# Patient Record
Sex: Female | Born: 1970 | Race: Black or African American | Hispanic: No | Marital: Married | Smoking: Never smoker
Health system: Southern US, Community
[De-identification: ages and names within clinical notes are randomized; demographics above are authoritative.]

## PROBLEM LIST (undated history)

## (undated) ENCOUNTER — Emergency Department (HOSPITAL_COMMUNITY): Discharge status: Absent without leave | Payer: 59

## (undated) DIAGNOSIS — D649 Anemia, unspecified: Secondary | ICD-10-CM

## (undated) DIAGNOSIS — D573 Sickle-cell trait: Secondary | ICD-10-CM

---

## 2016-12-13 NOTE — L&D Delivery Note (Signed)
Delivery Note After a 2 ctx 2nd stage, over a small MLE, at 8:46 PM a viable female was delivered via Vaginal, Spontaneous (Presentation: ;  LAO).  APGAR: 9, 9; weight  .   After 1 minute, the cord was clamped and cut. 40 units of pitocin diluted in 1000cc LR was infused rapidly IV.  The placenta separated spontaneously and delivered via CCT and maternal pushing effort.  It was inspected and appears to be intact with a 3 VC  Anesthesia:  Epidural, local Episiotomy:  MLE; Lacerations:   1st periclitoral (circumcision) lac Suture Repair: 2.0 chromic Est. Blood Loss (mL): 50  Mom to postpartum.  Baby to Couplet care / Skin to Skin.  Oconnor,Gina Kandel 11/24/2017, 9:10 PM  .

## 2017-08-22 LAB — OB RESULTS CONSOLE ANTIBODY SCREEN: Antibody Screen: NEGATIVE

## 2017-08-22 LAB — OB RESULTS CONSOLE HIV ANTIBODY (ROUTINE TESTING): HIV: NONREACTIVE

## 2017-08-22 LAB — OB RESULTS CONSOLE GC/CHLAMYDIA
Chlamydia: NEGATIVE
GC PROBE AMP, GENITAL: NEGATIVE

## 2017-08-22 LAB — OB RESULTS CONSOLE RPR: RPR: NONREACTIVE

## 2017-08-22 LAB — OB RESULTS CONSOLE RUBELLA ANTIBODY, IGM: RUBELLA: IMMUNE

## 2017-08-22 LAB — OB RESULTS CONSOLE HEPATITIS B SURFACE ANTIGEN: Hepatitis B Surface Ag: NEGATIVE

## 2017-08-22 LAB — OB RESULTS CONSOLE ABO/RH: RH Type: POSITIVE

## 2017-09-24 ENCOUNTER — Encounter (HOSPITAL_COMMUNITY): Payer: Self-pay | Admitting: Emergency Medicine

## 2017-09-24 ENCOUNTER — Emergency Department (HOSPITAL_COMMUNITY)
Admission: EM | Admit: 2017-09-24 | Discharge: 2017-09-24 | Disposition: A | Discharge status: Home / Self Care | Payer: BLUE CROSS/BLUE SHIELD | Attending: Emergency Medicine | Admitting: Emergency Medicine

## 2017-09-24 DIAGNOSIS — K0889 Other specified disorders of teeth and supporting structures: Secondary | ICD-10-CM | POA: Diagnosis present

## 2017-09-24 DIAGNOSIS — R600 Localized edema: Secondary | ICD-10-CM | POA: Insufficient documentation

## 2017-09-24 DIAGNOSIS — K029 Dental caries, unspecified: Secondary | ICD-10-CM | POA: Insufficient documentation

## 2017-09-24 DIAGNOSIS — R220 Localized swelling, mass and lump, head: Secondary | ICD-10-CM

## 2017-09-24 MED ORDER — AMOXICILLIN 500 MG PO CAPS
500.0000 mg | ORAL_CAPSULE | Freq: Once | ORAL | Status: AC
  Administered 2017-09-24: 500 mg via ORAL
  Filled 2017-09-24: qty 1

## 2017-09-24 MED ORDER — AMOXICILLIN 500 MG PO CAPS: 500.0000 mg | ORAL_CAPSULE | Freq: Two times a day (BID) | ORAL | 0 refills | Status: AC

## 2017-09-24 NOTE — ED Notes (Signed)
OB rapid respond RN called about pregnant pt with dental pain. OB RN to be called back when pt has a room on the back.

## 2017-09-24 NOTE — ED Triage Notes (Signed)
Pt c/o 10/10 right lower dental pain, having some fevers at home and swollen on her right side face, pt is 7 months pregnant.

## 2017-09-24 NOTE — ED Provider Notes (Signed)
[facility]-EMERGENCY DEPT Provider Note   CSN: [id number] Arrival date & time: 09/24/17  2025     History   Chief Complaint Chief Complaint  Patient presents with  . Dental Pain    possible abscess     HPI Gina Oconnor is a 46 y.o. female.  HPI  46 y.o. female presents to the Emergency Department today due to right lower dental pain. Rates pain 10/10. Described as throbbing. Notes swelling a subjective fevers at home. States that the pain occurred yesterday. Notes hx of dental caries. Pt is able to tolerate PO. NO trouble swallowing. No N/V. No headaches. No pain with ROM neck. Pt without dentist. Pt is 7 months pregnant. Has follow up with OBGYN on Monday. No abdominal pain. No vaginal bleeding. No other symptoms noted.    History reviewed. No pertinent past medical history.  There are no active problems to display for this patient.   History reviewed. No pertinent surgical history.  OB History    Gravida Para Term Preterm AB Living   1             SAB TAB Ectopic Multiple Live Births                   Home Medications    Prior to Admission medications   Not on File    Family History History reviewed. No pertinent family history.  Social History Social History  Substance Use Topics  . Smoking status: Never Smoker  . Smokeless tobacco: Never Used  . Alcohol use No     Allergies   Patient has no known allergies.   Review of Systems Review of Systems ROS reviewed and all are negative for acute change except as noted in the HPI  Physical Exam Updated Vital Signs BP 126/76 (BP Location: Right Arm)   Pulse (!) 109   Temp 99.1 F (37.3 C) (Oral)   Resp 14   Ht  (1.6 m)   LMP 02/25/2017   SpO2 100%   Physical Exam  Constitutional: She is oriented to person, place, and time. She appears well-developed and well-nourished. No distress.  HENT:  Head: Normocephalic and atraumatic.  Right Ear: Tympanic membrane, external ear and ear canal normal.    Left Ear: Tympanic membrane, external ear and ear canal normal.  Nose: Nose normal.  Mouth/Throat: Uvula is midline, oropharynx is clear and moist and mucous membranes are normal. No trismus in the jaw. No oropharyngeal exudate, posterior oropharyngeal erythema or tonsillar abscesses.  Right sided facial swelling noted. Oropharyngeal exam without obvious swelling. No dental abscess. No palpable fluctuance. No trismus. No ludwigs. ROM neck intact without pain.   Eyes: Pupils are equal, round, and reactive to light. EOM are normal.  Neck: Normal range of motion. Neck supple. No tracheal deviation present.  Cardiovascular: Regular rhythm, S1 normal, S2 normal, normal heart sounds, intact distal pulses and normal pulses.  Tachycardia present.   Pulmonary/Chest: Effort normal and breath sounds normal. No respiratory distress. She has no decreased breath sounds. She has no wheezes. She has no rhonchi. She has no rales.  Abdominal: Normal appearance and bowel sounds are normal. There is no tenderness.  Musculoskeletal: Normal range of motion.  Neurological: She is alert and oriented to person, place, and time.  Skin: Skin is warm and dry.  Psychiatric: She has a normal mood and affect. Her speech is normal and behavior is normal. Thought content normal.     ED Treatments / Results  Labs (all labs ordered are listed, but only abnormal results are displayed) Labs Reviewed - No data to display  EKG  EKG Interpretation None       Radiology No results found.  Procedures Procedures (including critical care time)  Medications Ordered in ED Medications - No data to display   Initial Impression / Assessment and Plan / ED Course  I have reviewed the triage vital signs and the nursing notes.  Pertinent labs & imaging results that were available during my care of the patient were reviewed by me and considered in my medical decision making (see chart for details).  Final Clinical  Impressions(s) / ED Diagnoses     {I have reviewed the relevant previous healthcare records.  {I obtained HPI from historian.   ED Course:  Assessment: Dental pain associated with dental cary but no signs or symptoms of dental abscess with patient afebrile, non toxic appearing and swallowing secretions well. Exam unconcerning for Ludwig's angina or other deep tissue infection in neck. As there is no gum swelling, no erythema, but facial swelling, will treat with antibiotic and pain medicine. Urged patient to follow-up with dentist. Given resources.    I gave patient referral to dentist and stressed the importance of dental follow up for ultimate management of dental pain. Patient voices understanding and is agreeable to plan  Disposition/Plan:  DC Home Additional Verbal discharge instructions given and discussed with patient.  Pt Instructed to f/u with PCP in the next week for evaluation and treatment of symptoms. Return precautions given Pt acknowledges and agrees with plan  Supervising Physician Loren Racer, MD  Final diagnoses:  Dental caries  Pain, dental  Facial swelling    New Prescriptions New Prescriptions   No medications on file     Wilber Bihari 09/24/17 2222    Loren Racer, MD 09/27/17 [phone]

## 2017-09-24 NOTE — Discharge Instructions (Signed)
Please read and follow all provided instructions.  Your diagnoses today include:  1. Dental caries   2. Pain, dental   3. Facial swelling     Tests performed today include: Vital signs. See below for your results today.   Medications prescribed:  Take as prescribed   Home care instructions:  Follow any educational materials contained in this packet.  Follow-up instructions: Please follow-up with a Dentist for further evaluation of symptoms and treatment. See contact information.    Return instructions:  Please return to the Emergency Department if you do not get better, if you get worse, or new symptoms OR  - Fever (temperature greater than 101.103F)  - Bleeding that does not stop with holding pressure to the area    -Severe pain (please note that you may be more sore the day after your accident)  - Chest Pain  - Difficulty breathing  - Severe nausea or vomiting  - Inability to tolerate food and liquids  - Passing out  - Skin becoming red around your wounds  - Change in mental status (confusion or lethargy)  - New numbness or weakness    Please return if you have any other emergent concerns.  Additional Information:  Your vital signs today were: BP 126/76 (BP Location: Right Arm)    Pulse (!) 109    Temp 99.1 F (37.3 C) (Oral)    Resp 14    Ht  (1.6 m)    LMP 02/25/2017    SpO2 100%  If your blood pressure (BP) was elevated above 135/85 this visit, please have this repeated by your doctor within one month. ---------------

## 2017-11-07 LAB — OB RESULTS CONSOLE GBS: STREP GROUP B AG: NEGATIVE

## 2017-11-18 ENCOUNTER — Encounter (HOSPITAL_COMMUNITY): Payer: Self-pay

## 2017-11-18 ENCOUNTER — Other Ambulatory Visit (HOSPITAL_COMMUNITY): Payer: Self-pay | Admitting: Family

## 2017-11-18 DIAGNOSIS — O09521 Supervision of elderly multigravida, first trimester: Secondary | ICD-10-CM

## 2017-11-18 DIAGNOSIS — Z3A38 38 weeks gestation of pregnancy: Secondary | ICD-10-CM

## 2017-11-22 ENCOUNTER — Encounter (HOSPITAL_COMMUNITY): Payer: Self-pay

## 2017-11-22 ENCOUNTER — Ambulatory Visit (HOSPITAL_COMMUNITY)
Admission: RE | Admit: 2017-11-22 | Discharge: 2017-11-22 | Disposition: A | Discharge status: Home / Self Care | Payer: BLUE CROSS/BLUE SHIELD | Source: Ambulatory Visit | Attending: Obstetrics & Gynecology | Admitting: Obstetrics & Gynecology

## 2017-11-22 DIAGNOSIS — O09523 Supervision of elderly multigravida, third trimester: Secondary | ICD-10-CM | POA: Diagnosis present

## 2017-11-22 DIAGNOSIS — Z3A38 38 weeks gestation of pregnancy: Secondary | ICD-10-CM | POA: Insufficient documentation

## 2017-11-22 DIAGNOSIS — Z315 Encounter for genetic counseling: Secondary | ICD-10-CM | POA: Insufficient documentation

## 2017-11-22 DIAGNOSIS — O09521 Supervision of elderly multigravida, first trimester: Secondary | ICD-10-CM

## 2017-11-22 HISTORY — DX: Sickle-cell trait: D57.3

## 2017-11-22 HISTORY — DX: Anemia, unspecified: D64.9

## 2017-11-22 NOTE — Procedures (Signed)
Stormie Goettl 05/19/1971 5352w4d  Fetus A Non-Stress Test Interpretation for 11/22/17  Indication: Advanced Maternal Age >40 years  Fetal Heart Rate A Mode: External Baseline Rate (A): 140 bpm Variability: Moderate Accelerations: 15 x 15 Decelerations: None Multiple birth?: No  Uterine Activity Mode: Palpation, Toco Contraction Frequency (min): 2-4 Contraction Duration (sec): 20-120 Contraction Quality: Mild Resting Tone Palpated: Relaxed Resting Time: Adequate  Interpretation (Fetal Testing) Nonstress Test Interpretation: Reactive Overall Impression: Reassuring for gestational age Comments: Reviewed tracing with Dr. Ezzard StandingNewman

## 2017-11-24 ENCOUNTER — Inpatient Hospital Stay (HOSPITAL_COMMUNITY): Payer: BLUE CROSS/BLUE SHIELD | Admitting: Anesthesiology

## 2017-11-24 ENCOUNTER — Other Ambulatory Visit: Payer: Self-pay

## 2017-11-24 ENCOUNTER — Encounter (HOSPITAL_COMMUNITY): Payer: Self-pay

## 2017-11-24 ENCOUNTER — Inpatient Hospital Stay (HOSPITAL_COMMUNITY)
Admission: AD | Admit: 2017-11-24 | Discharge: 2017-11-26 | DRG: 807 | Disposition: A | Discharge status: Home / Self Care | Payer: BLUE CROSS/BLUE SHIELD | Source: Ambulatory Visit | Attending: Obstetrics and Gynecology | Admitting: Obstetrics and Gynecology

## 2017-11-24 DIAGNOSIS — O134 Gestational [pregnancy-induced] hypertension without significant proteinuria, complicating childbirth: Principal | ICD-10-CM | POA: Diagnosis present

## 2017-11-24 DIAGNOSIS — N90810 Female genital mutilation status, unspecified: Secondary | ICD-10-CM | POA: Diagnosis present

## 2017-11-24 DIAGNOSIS — O3483 Maternal care for other abnormalities of pelvic organs, third trimester: Secondary | ICD-10-CM | POA: Diagnosis present

## 2017-11-24 DIAGNOSIS — Z3A38 38 weeks gestation of pregnancy: Secondary | ICD-10-CM

## 2017-11-24 DIAGNOSIS — Z23 Encounter for immunization: Secondary | ICD-10-CM | POA: Diagnosis not present

## 2017-11-24 DIAGNOSIS — Z3483 Encounter for supervision of other normal pregnancy, third trimester: Secondary | ICD-10-CM | POA: Diagnosis present

## 2017-11-24 DIAGNOSIS — O9902 Anemia complicating childbirth: Secondary | ICD-10-CM | POA: Diagnosis present

## 2017-11-24 DIAGNOSIS — D573 Sickle-cell trait: Secondary | ICD-10-CM | POA: Diagnosis present

## 2017-11-24 DIAGNOSIS — O165 Unspecified maternal hypertension, complicating the puerperium: Secondary | ICD-10-CM

## 2017-11-24 LAB — COMPREHENSIVE METABOLIC PANEL
ALBUMIN: 2.9 g/dL — AB (ref 3.5–5.0)
ALT: 9 U/L — AB (ref 14–54)
AST: 20 U/L (ref 15–41)
Alkaline Phosphatase: 163 U/L — ABNORMAL HIGH (ref 38–126)
Anion gap: 10 (ref 5–15)
BUN: 7 mg/dL (ref 6–20)
CALCIUM: 8.6 mg/dL — AB (ref 8.9–10.3)
CO2: 19 mmol/L — AB (ref 22–32)
CREATININE: 0.55 mg/dL (ref 0.44–1.00)
Chloride: 105 mmol/L (ref 101–111)
GFR calc Af Amer: >60 mL/min (ref >60)
GFR calc non Af Amer: >60 mL/min (ref >60)
GLUCOSE: 81 mg/dL (ref 65–99)
Potassium: 3.8 mmol/L (ref 3.5–5.1)
SODIUM: 134 mmol/L — AB (ref 135–145)
Total Bilirubin: 0.5 mg/dL (ref 0.3–1.2)
Total Protein: 6.8 g/dL (ref 6.5–8.1)

## 2017-11-24 LAB — PROTEIN / CREATININE RATIO, URINE
Creatinine, Urine: 36 mg/dL
Protein Creatinine Ratio: 0.22 mg/mg{Cre} — ABNORMAL HIGH (ref 0.00–0.15)
Total Protein, Urine: 8 mg/dL

## 2017-11-24 LAB — ABO/RH: ABO/RH(D): O POS

## 2017-11-24 LAB — CBC
HEMATOCRIT: 34.1 % — AB (ref 36.0–46.0)
HEMOGLOBIN: 11.8 g/dL — AB (ref 12.0–15.0)
MCH: 30.6 pg (ref 26.0–34.0)
MCHC: 34.6 g/dL (ref 30.0–36.0)
MCV: 88.6 fL (ref 78.0–100.0)
Platelets: 296 10*3/uL (ref 150–400)
RBC: 3.85 MIL/uL — ABNORMAL LOW (ref 3.87–5.11)
RDW: 13.7 % (ref 11.5–15.5)
WBC: 8.6 10*3/uL (ref 4.0–10.5)

## 2017-11-24 LAB — TYPE AND SCREEN
ABO/RH(D): O POS
ANTIBODY SCREEN: NEGATIVE

## 2017-11-24 LAB — POCT FERN TEST

## 2017-11-24 MED ORDER — FLEET ENEMA 7-19 GM/118ML RE ENEM: 1.0000 | ENEMA | RECTAL | Status: DC | PRN

## 2017-11-24 MED ORDER — LACTATED RINGERS IV SOLN: 500.0000 mL | INTRAVENOUS | Status: DC | PRN

## 2017-11-24 MED ORDER — OXYTOCIN 40 UNITS IN LACTATED RINGERS INFUSION - SIMPLE MED
2.5000 [IU]/h | INTRAVENOUS | Status: DC
  Administered 2017-11-24: 2.5 [IU]/h via INTRAVENOUS

## 2017-11-24 MED ORDER — OXYCODONE-ACETAMINOPHEN 5-325 MG PO TABS: 2.0000 | ORAL_TABLET | ORAL | Status: DC | PRN

## 2017-11-24 MED ORDER — PHENYLEPHRINE 40 MCG/ML (10ML) SYRINGE FOR IV PUSH (FOR BLOOD PRESSURE SUPPORT)
80.0000 ug | PREFILLED_SYRINGE | INTRAVENOUS | Status: DC | PRN
  Filled 2017-11-24: qty 5

## 2017-11-24 MED ORDER — OXYCODONE-ACETAMINOPHEN 5-325 MG PO TABS: 1.0000 | ORAL_TABLET | ORAL | Status: DC | PRN

## 2017-11-24 MED ORDER — PRENATAL MULTIVITAMIN CH
1.0000 | ORAL_TABLET | Freq: Every day | ORAL | Status: DC
  Administered 2017-11-25 – 2017-11-26 (×2): 1 via ORAL
  Filled 2017-11-24 (×2): qty 1

## 2017-11-24 MED ORDER — FENTANYL 2.5 MCG/ML BUPIVACAINE 1/10 % EPIDURAL INFUSION (WH - ANES)
14.0000 mL/h | INTRAMUSCULAR | Status: DC | PRN
  Administered 2017-11-24: 14 mL/h via EPIDURAL

## 2017-11-24 MED ORDER — MEASLES, MUMPS & RUBELLA VAC ~~LOC~~ INJ
0.5000 mL | INJECTION | Freq: Once | SUBCUTANEOUS | Status: DC
  Filled 2017-11-24: qty 0.5

## 2017-11-24 MED ORDER — COCONUT OIL OIL
1.0000 "application " | TOPICAL_OIL | Status: DC | PRN
  Administered 2017-11-26: 1 via TOPICAL
  Filled 2017-11-24: qty 120

## 2017-11-24 MED ORDER — FENTANYL 2.5 MCG/ML BUPIVACAINE 1/10 % EPIDURAL INFUSION (WH - ANES)
INTRAMUSCULAR | Status: AC
  Filled 2017-11-24: qty 100

## 2017-11-24 MED ORDER — WITCH HAZEL-GLYCERIN EX PADS: 1.0000 "application " | MEDICATED_PAD | CUTANEOUS | Status: DC | PRN

## 2017-11-24 MED ORDER — DOCUSATE SODIUM 100 MG PO CAPS
100.0000 mg | ORAL_CAPSULE | Freq: Two times a day (BID) | ORAL | Status: DC
  Administered 2017-11-25 – 2017-11-26 (×3): 100 mg via ORAL
  Filled 2017-11-24 (×3): qty 1

## 2017-11-24 MED ORDER — ACETAMINOPHEN 325 MG PO TABS
650.0000 mg | ORAL_TABLET | ORAL | Status: DC | PRN
  Administered 2017-11-25 – 2017-11-26 (×2): 650 mg via ORAL
  Filled 2017-11-24: qty 2

## 2017-11-24 MED ORDER — DIPHENHYDRAMINE HCL 25 MG PO CAPS: 25.0000 mg | ORAL_CAPSULE | Freq: Four times a day (QID) | ORAL | Status: DC | PRN

## 2017-11-24 MED ORDER — OXYTOCIN 40 UNITS IN LACTATED RINGERS INFUSION - SIMPLE MED
1.0000 m[IU]/min | INTRAVENOUS | Status: DC
  Administered 2017-11-24: 2 m[IU]/min via INTRAVENOUS
  Filled 2017-11-24: qty 1000

## 2017-11-24 MED ORDER — DIPHENHYDRAMINE HCL 50 MG/ML IJ SOLN: 12.5000 mg | INTRAMUSCULAR | Status: DC | PRN

## 2017-11-24 MED ORDER — ONDANSETRON HCL 4 MG/2ML IJ SOLN: 4.0000 mg | INTRAMUSCULAR | Status: DC | PRN

## 2017-11-24 MED ORDER — ACETAMINOPHEN 325 MG PO TABS
650.0000 mg | ORAL_TABLET | ORAL | Status: DC | PRN
  Filled 2017-11-24: qty 2

## 2017-11-24 MED ORDER — DIBUCAINE 1 % RE OINT: 1.0000 "application " | TOPICAL_OINTMENT | RECTAL | Status: DC | PRN

## 2017-11-24 MED ORDER — BENZOCAINE-MENTHOL 20-0.5 % EX AERO: 1.0000 "application " | INHALATION_SPRAY | CUTANEOUS | Status: DC | PRN

## 2017-11-24 MED ORDER — ONDANSETRON HCL 4 MG PO TABS: 4.0000 mg | ORAL_TABLET | ORAL | Status: DC | PRN

## 2017-11-24 MED ORDER — EPHEDRINE 5 MG/ML INJ
10.0000 mg | INTRAVENOUS | Status: DC | PRN
  Filled 2017-11-24: qty 2

## 2017-11-24 MED ORDER — FERROUS SULFATE 325 (65 FE) MG PO TABS
325.0000 mg | ORAL_TABLET | Freq: Two times a day (BID) | ORAL | Status: DC
  Administered 2017-11-25 – 2017-11-26 (×4): 325 mg via ORAL
  Filled 2017-11-24 (×4): qty 1

## 2017-11-24 MED ORDER — BISACODYL 10 MG RE SUPP: 10.0000 mg | Freq: Every day | RECTAL | Status: DC | PRN

## 2017-11-24 MED ORDER — TETANUS-DIPHTH-ACELL PERTUSSIS 5-2.5-18.5 LF-MCG/0.5 IM SUSP: 0.5000 mL | Freq: Once | INTRAMUSCULAR | Status: DC

## 2017-11-24 MED ORDER — METHYLERGONOVINE MALEATE 0.2 MG/ML IJ SOLN: 0.2000 mg | INTRAMUSCULAR | Status: DC | PRN

## 2017-11-24 MED ORDER — INFLUENZA VAC SPLIT QUAD 0.5 ML IM SUSY
0.5000 mL | PREFILLED_SYRINGE | INTRAMUSCULAR | Status: AC
  Administered 2017-11-26: 0.5 mL via INTRAMUSCULAR
  Filled 2017-11-24: qty 0.5

## 2017-11-24 MED ORDER — FENTANYL CITRATE (PF) 100 MCG/2ML IJ SOLN: 100.0000 ug | INTRAMUSCULAR | Status: DC | PRN

## 2017-11-24 MED ORDER — FLEET ENEMA 7-19 GM/118ML RE ENEM: 1.0000 | ENEMA | Freq: Every day | RECTAL | Status: DC | PRN

## 2017-11-24 MED ORDER — TERBUTALINE SULFATE 1 MG/ML IJ SOLN
0.2500 mg | Freq: Once | INTRAMUSCULAR | Status: DC | PRN
  Filled 2017-11-24: qty 1

## 2017-11-24 MED ORDER — SOD CITRATE-CITRIC ACID 500-334 MG/5ML PO SOLN: 30.0000 mL | ORAL | Status: DC | PRN

## 2017-11-24 MED ORDER — ONDANSETRON HCL 4 MG/2ML IJ SOLN: 4.0000 mg | Freq: Four times a day (QID) | INTRAMUSCULAR | Status: DC | PRN

## 2017-11-24 MED ORDER — METHYLERGONOVINE MALEATE 0.2 MG PO TABS: 0.2000 mg | ORAL_TABLET | ORAL | Status: DC | PRN

## 2017-11-24 MED ORDER — LACTATED RINGERS IV SOLN
500.0000 mL | Freq: Once | INTRAVENOUS | Status: AC
  Administered 2017-11-24: 500 mL via INTRAVENOUS

## 2017-11-24 MED ORDER — LIDOCAINE HCL (PF) 1 % IJ SOLN
INTRAMUSCULAR | Status: DC | PRN
  Administered 2017-11-24: 3 mL via EPIDURAL
  Administered 2017-11-24: 2 mL via EPIDURAL
  Administered 2017-11-24: 5 mL via EPIDURAL

## 2017-11-24 MED ORDER — IBUPROFEN 600 MG PO TABS
600.0000 mg | ORAL_TABLET | Freq: Four times a day (QID) | ORAL | Status: DC
  Administered 2017-11-25 – 2017-11-26 (×8): 600 mg via ORAL
  Filled 2017-11-24 (×8): qty 1

## 2017-11-24 MED ORDER — OXYCODONE HCL 5 MG PO TABS: 5.0000 mg | ORAL_TABLET | ORAL | Status: DC | PRN

## 2017-11-24 MED ORDER — ZOLPIDEM TARTRATE 5 MG PO TABS
5.0000 mg | ORAL_TABLET | Freq: Every evening | ORAL | Status: DC | PRN
Start: 2017-11-24 — End: 2017-11-26

## 2017-11-24 MED ORDER — SIMETHICONE 80 MG PO CHEW: 80.0000 mg | CHEWABLE_TABLET | ORAL | Status: DC | PRN

## 2017-11-24 MED ORDER — LIDOCAINE HCL (PF) 1 % IJ SOLN
30.0000 mL | INTRAMUSCULAR | Status: AC | PRN
  Administered 2017-11-24: 30 mL via SUBCUTANEOUS
  Filled 2017-11-24: qty 30

## 2017-11-24 MED ORDER — LACTATED RINGERS IV SOLN
INTRAVENOUS | Status: DC
  Administered 2017-11-24 (×3): via INTRAVENOUS

## 2017-11-24 MED ORDER — OXYCODONE HCL 5 MG PO TABS: 10.0000 mg | ORAL_TABLET | ORAL | Status: DC | PRN

## 2017-11-24 MED ORDER — LACTATED RINGERS IV SOLN: 500.0000 mL | Freq: Once | INTRAVENOUS | Status: DC

## 2017-11-24 MED ORDER — OXYTOCIN BOLUS FROM INFUSION
500.0000 mL | Freq: Once | INTRAVENOUS | Status: AC
  Administered 2017-11-24: 500 mL via INTRAVENOUS

## 2017-11-24 NOTE — H&P (Signed)
LABOR AND DELIVERY ADMISSION HISTORY AND PHYSICAL NOTE  Gina Oconnor is a 46 y.o. female 446P5005 with IUP at 633w6d presenting with SROM. She experienced gush of clear liquid at midnight 12/12, color later turned brown.  She reports positive fetal movement. She denies bleeding.  Prenatal History/Complications: PNC at HD Pregnancy complications:  - AMA  Past Medical History: Past Medical History:  Diagnosis Date  . Anemia   . Sickle cell trait (HCC)     Past Surgical History: History reviewed. No pertinent surgical history.  Obstetrical History: OB History    Gravida Para Term Preterm AB Living   6 5 5     5    SAB TAB Ectopic Multiple Live Births                  Social History: Social History   Socioeconomic History  . Marital status: Married    Spouse name: None  . Number of children: None  . Years of education: None  . Highest education level: None  Social Needs  . Financial resource strain: None  . Food insecurity - worry: None  . Food insecurity - inability: None  . Transportation needs - medical: None  . Transportation needs - non-medical: None  Occupational History  . None  Tobacco Use  . Smoking status: Never Smoker  . Smokeless tobacco: Never Used  Substance and Sexual Activity  . Alcohol use: No  . Drug use: No  . Sexual activity: None  Other Topics Concern  . None  Social History Narrative  . None    Family History: History reviewed. No pertinent family history.  Allergies: No Known Allergies  Medications Prior to Admission  Medication Sig Dispense Refill Last Dose  . acetaminophen (TYLENOL) 325 MG tablet Take 650 mg by mouth every 6 (six) hours as needed for mild pain.    Past Month at Unknown time  . Prenatal Vit-Fe Fumarate-FA (PRENATAL MULTIVITAMIN) TABS tablet Take 1 tablet by mouth daily at 12 noon.   11/23/2017 at Unknown time     Review of Systems  All systems reviewed and negative except as stated in HPI  Physical  Exam Blood pressure 123/85, pulse 80, temperature 99.1 F (37.3 C), temperature source Oral, resp. rate 17, height 5\' 5"  (1.651 m), weight 62.6 kg (138 lb), last menstrual period 02/25/2017, SpO2 100 %. General appearance: alert and cooperative Lungs: no respiratory distress Heart: regular rate with no distress Abdomen: soft, non-tender; bowel sounds normal Extremities: No calf swelling or tenderness Presentation: cephalic Fetal monitoring: external Uterine activity: [place]: Ballotable Exam by:: Intelolita CNM  Prenatal labs: ABO, Rh: O/Positive/-- (09/10 0000) Antibody: Negative (09/10 0000) Rubella: Immune (09/10 0000) RPR: Nonreactive (09/10 0000)  HBsAg: Negative (09/10 0000)  HIV: Non-reactive (09/10 0000)  GC/Chlamydia: Neg GBS:   Negative on 11/21 per HD 1 hr Glucola: 115 Genetic screening:  SCT pos, CF neg Anatomy US: normal at 7654w3d  Prenatal Transfer Tool  Maternal Diabetes: No Genetic Screening: Abnormal:  Results: Other: SCT pos Maternal Ultrasounds/Referrals: Normal Fetal Ultrasounds or other Referrals:  Referred to Materal Fetal Medicine  Maternal Substance Abuse:  No Significant Maternal Medications:  None Significant Maternal Lab Results: None  Results for orders placed or performed during the hospital encounter of 11/24/17 (from the past 24 hour(s))  Fern Test   Collection Time: 11/24/17 10:30 AM  Result Value Ref Range   POCT Fern Test      Patient Active Problem List   Diagnosis Date Noted  .  Normal labor 11/24/2017    Assessment: Gina Oconnor is a 46 y.o. G6P5005 at 1170w6d here for SVD. She experienced SROM at midnight 12/12, fern test positive.  #Labor: will consider pitocin given cervical check @4  #Pain: requests epidural #FWB:  Category I #ID: Patient is not certain about GBS status.  Spoke with Linus OrnMary Shaw, RN at HD who reported negative on 11/21. #MOF: Breast #MOC: Has previously used OCPs, but developed HTN.  Is unsure about future  contraception. #Circ: n/a  Larose HiresChristopher Trennepohl 11/24/2017, 12:48 PM   I have personally seen the patient and verify the information documented above -Dr. Marthenia RollingScott Bland  The patient was seen and examined by me also Resident actually did the H&P, witnessed by me Interpretor used on phone Agree with note NST reactive and reassuring UCs as listed Cervical exams as listed in note  Prefers female providers but is willing to have female if medically necessary  Aviva SignsWilliams, Marie L, CNM  I confirm that I have verified the information documented in the resident's note and that I have also personally reperformed the physical exam and all medical decision making activities.  Aviva SignsWilliams, Marie L, CNM

## 2017-11-24 NOTE — Anesthesia Pain Management Evaluation Note (Signed)
  [facility]NA Pain Management Visit Note  Patient: Gina Oconnor, 46 y.o., female  "Hello I am a [facility] of the anesthesia [facility]. We have an anesthesia team available at all times to provide care throughout the hospital, including epidural management and anesthesia for C-section. I don't know your plan for the delivery whether it a natural birth, water birth, IV sedation, nitrous supplementation, doula or epidural, but we want to meet your pain goals."   1.Was your pain managed to your expectations on prior hospitalizations?   Yes   2.What is your expectation for pain management during this hospitalization?     Epidural  3.How can we help you reach that goal? Assess and be available for pain control  Record the patient's initial score and the patient's pain goal.   Pain: 5  Pain Goal: 6 [facility] wants you to be able to say your pain was always managed very well.  Gina Oconnor, Gina Oconnor 11/24/2017

## 2017-11-24 NOTE — Anesthesia Procedure Notes (Signed)
Epidural Patient location during procedure: OB Start time: 11/24/2017 3:31 PM End time: 11/24/2017 3:37 PM  Staffing Anesthesiologist: Cecile Hearingurk, Stephen Edward, MD Performed: anesthesiologist   Preanesthetic Checklist Completed: patient identified, pre-op evaluation, timeout performed, IV checked, risks and benefits discussed and monitors and equipment checked  Epidural Patient position: sitting Prep: DuraPrep Patient monitoring: blood pressure and continuous pulse ox Approach: midline Location: L3-L4 Injection technique: LOR air  Needle:  Needle type: Tuohy  Needle gauge: 17 G Needle length: 9 cm Needle insertion depth: 4 cm Catheter size: 19 Gauge Catheter at skin depth: 8 cm Test dose: negative and Other (1% Lidocaine)  Additional Notes Patient identified.  Risk benefits discussed including failed block, incomplete pain control, headache, nerve damage, paralysis, blood pressure changes, nausea, vomiting, reactions to medication both toxic or allergic, and postpartum back pain.  Patient expressed understanding and wished to proceed.  All questions were answered.  Sterile technique used throughout procedure and epidural site dressed with sterile barrier dressing. No paresthesia or other complications noted. The patient did not experience any signs of intravascular injection such as tinnitus or metallic taste in mouth nor signs of intrathecal spread such as rapid motor block. Please see nursing notes for vital signs. Reason for block:procedure for pain

## 2017-11-24 NOTE — Anesthesia Preprocedure Evaluation (Signed)
Anesthesia Evaluation  Patient identified by MRN, date of birth, ID band Patient awake    Reviewed: Allergy & Precautions, NPO status , Patient's Chart, lab work & pertinent test results  Airway Mallampati: II  TM Distance: >3 FB Neck ROM: Full    Dental  (+) Teeth Intact, Dental Advisory Given   Pulmonary neg pulmonary ROS,    Pulmonary exam normal breath sounds clear to auscultation       Cardiovascular negative cardio ROS Normal cardiovascular exam Rhythm:Regular Rate:Normal     Neuro/Psych negative neurological ROS     GI/Hepatic negative GI ROS, Neg liver ROS,   Endo/Other  negative endocrine ROS  Renal/GU negative Renal ROS     Musculoskeletal negative musculoskeletal ROS (+)   Abdominal   Peds  Hematology  (+) Blood dyscrasia, Sickle cell trait and anemia , Plt 296k   Anesthesia Other Findings Day of surgery medications reviewed with the patient.  Reproductive/Obstetrics (+) Pregnancy                             Anesthesia Physical Anesthesia Plan  ASA: II  Anesthesia Plan: Epidural   Post-op Pain Management:    Induction:   PONV Risk Score and Plan: 2 and Treatment may vary due to age or medical condition  Airway Management Planned:   Additional Equipment:   Intra-op Plan:   Post-operative Plan:   Informed Consent: I have reviewed the patients History and Physical, chart, labs and discussed the procedure including the risks, benefits and alternatives for the proposed anesthesia with the patient or authorized representative who has indicated his/her understanding and acceptance.   Dental advisory given  Plan Discussed with:   Anesthesia Plan Comments: (Patient identified. Risks/Benefits/Options discussed with patient including but not limited to bleeding, infection, nerve damage, paralysis, failed block, incomplete pain control, headache, blood pressure changes,  nausea, vomiting, reactions to medication both or allergic, itching and postpartum back pain. Confirmed with bedside nurse the patient's most recent platelet count. Confirmed with patient that they are not currently taking any anticoagulation, have any bleeding history or any family history of bleeding disorders. Patient expressed understanding and wished to proceed. All questions were answered.   Interpreter utilized throughout entire patient encounter.)        Anesthesia Quick Evaluation

## 2017-11-24 NOTE — MAU Note (Signed)
[facility] position of fetus - vertex position.

## 2017-11-24 NOTE — MAU Note (Signed)
Interpreter [id number]: Pt. Here due to possible SROM at midnight, at first clear fluid, then white, then brown with bloody spots. Large amount. Positive for fetal movement. FHR - 140s, Toco applied - abd. Soft. Pt. Not experiencing many contractions.

## 2017-11-25 LAB — RPR: RPR Ser Ql: NONREACTIVE

## 2017-11-25 NOTE — Anesthesia Postprocedure Evaluation (Signed)
Anesthesia Post Note  Patient: Gina Oconnor  Procedure(s) Performed: AN AD HOC LABOR EPIDURAL     Patient location during evaluation: Mother Baby Anesthesia Type: Epidural Level of consciousness: awake and alert, oriented and patient cooperative Pain management: pain level controlled Vital Signs Assessment: post-procedure vital signs reviewed and stable Respiratory status: spontaneous breathing Cardiovascular status: stable Postop Assessment: no headache, epidural receding, patient able to bend at knees and no signs of nausea or vomiting Anesthetic complications: no Comments: Pain score 5.    Last Vitals:  Vitals:   11/25/17 0010 11/25/17 0411  BP: 134/72 125/70  Pulse: 87 78  Resp: 20   Temp: 37.1 C 37.1 C  SpO2:  100%    Last Pain:  Vitals:   11/25/17 0611  TempSrc:   PainSc: 0-No pain   Pain Goal:                 [facility] Surgical Center LtdWRINKLE,Alis Sawchuk

## 2017-11-25 NOTE — Progress Notes (Signed)
Post Partum Day 1 Subjective: no complaints, up ad lib and voiding  Objective: Blood pressure 132/81, pulse 79, temperature 98.6 F (37 C), temperature source Oral, resp. rate 16, height 5\' 5"  (1.651 m), weight 138 lb (62.6 kg), last menstrual period 02/25/2017, SpO2 100 %, unknown if currently breastfeeding.  Physical Exam:  General: alert, cooperative and no distress Lochia: appropriate Uterine Fundus: firm Incision: healing well DVT Evaluation: No evidence of DVT seen on physical exam.  Recent Labs    11/24/17 1055  HGB 11.8*  HCT 34.1*    Assessment/Plan: Plan for discharge tomorrow and Breastfeeding   LOS: 1 day   Wynelle BourgeoisMarie Matan Steen 11/25/2017, 11:47 AM

## 2017-11-25 NOTE — Clinical Social Work Maternal (Signed)
CLINICAL SOCIAL WORK MATERNAL/CHILD NOTE  Patient Details  Name: Gina Oconnor MRN: [id number] Date of Birth: [birth year]  Date:  11/25/2017  Clinical Social Worker Initiating Note:  Laurey Arrow Date/Time: Initiated:  11/25/17/1311     Child's Name:  unknown   Biological Parents:  Mother, Father   Need for Interpreter:  None(CSW offered to utilized interpreting services, however, MOB was adamant that MOB brother assist with language barrier. )   Reason for Referral:  Other (Comment)(community resources. )   Address:  [address] [place] [address]    Phone number:  [phone] (home)     Additional phone number:   Household Members/Support Persons (HM/SP):   (MOB resides with MOB's husband and 6 children. )   HM/SP Name Relationship DOB or Age  HM/SP -1        HM/SP -2        HM/SP -3        HM/SP -4        HM/SP -5        HM/SP -6        HM/SP -7        HM/SP -8          Natural Supports (not living in the home):  Friends, Extended Family   Professional Supports: None   Employment: Unemployed   Type of Work:     Education:      Homebound arranged:    Pensions consultant:  Multimedia programmer, [employer]   Other Resources:  Physicist, medical , [place] Considerations Which May Impact Care:  None reported  Strengths:  Ability to meet basic needs , Pediatrician chosen   Psychotropic Medications:         Pediatrician:    [place]olic[place] area  Pediatrician List:   [facility] ([address])  [place]      Pediatrician Fax Number:    Risk Factors/Current Problems:  None   Cognitive State:  Alert , Able to Concentrate , Linear Thinking    Mood/Affect:  Bright , Interested , Happy , Comfortable , Relaxed    CSW Assessment: CSW met with MOB and in room 133.  When CSW arrived, MOB had 2 female guest that  MOB identified as MOB's brothers.  MOB gave CSW permission to complete the assessment while MOB's guest were present. CSW attempting to utilized "[place]" for language barrier and MOB declined interpreter and communicated that MOB's brother can assist if needed.   CSW inquired about MOB having basic necessities for infant and MOB reported that MOB receives [facility] and Physicist, medical.  CSW asked about a car seat and MOB's brother informed CSW that the family will obtain a car seat prior to infant's d/c.  MOB communicated that MOB was informed that Medicaid will pay for the infant's car seat . CSW informed MOB that Medicaid does not provide car seats and assessed to see if the family will need assistance with obtaining one. MOB's brother was adamant that a car seat will be provided. CSW asked about a safe sleeping area for infant. At this time, MOB reported not having a crib or bassinet.  CSW suggested MOB complete the tutorial for a Baby Box, and MOB was receptive. MOB will provide MOB's bedside nurse with the tutorial code after completion.   CSW provided SIDS and PDD education.  MOB responded appropriate to CSW questions and asked appropriate questions.  CSW updated bedside nurse.   CSW Plan/Description:  No Further Intervention Required/No Barriers to Discharge, Sudden Infant Death Syndrome (SIDS) Education, Perinatal Mood and Anxiety Disorder (PMADs) Education, Other Patient/Family Education   Laurey Arrow, MSW, LCSW Clinical Social Work [phone]    Dimple Nanas, LCSW 11/25/2017, 1:14 PM

## 2017-11-25 NOTE — Lactation Note (Signed)
This note was copied from a baby's chart. Lactation Consultation Note  Patient Name: Gina Oconnor Today's Date: 11/25/2017 Reason for consult: Initial assessment Infant is 5218 hours old & seen by Lactation for Initial Assessment. Baby was born at 6358w6d and weighed 6 lbs 4.9 oz at birth. This is mom's 6th child. Mom's RN called LC to discuss how challenging it has been trying to latch baby and thinks it is due to mom's larger nipples and baby's smaller mouth. Mom's sister-in-law interpreted for us (declined need for video interpreter). Baby was asleep when LC entered. Mom reports that baby has been sleeping a lot and when asked if mom wanted to try BF now, mom declined. Mom reports she BF for ~7625yrs with her other children and that one of them had a difficult time latching onto her left breast in the beginning as well but eventually learned. Mom reports she has tried to hand express but nothing was seen. Suggested mom use DEBP to stimulate breasts since baby is not BF well. Provided mom with BF booklet, BF resources, and feeding log; mom made aware of O/P services, breastfeeding support groups, community resources, and our phone # for post-discharge questions. Mom encouraged to feed baby 8-12 times/24 hours and with feeding cues.  Set up & showed mom how to use & clean DEBP. Encouraged mom to pump on Initiate phase at least q 3hrs if baby does not latch well. Size 27mm breast shields fit better than the 24mm. Reviewed hand expression with mom but no drops were seen then either. Discussed importance of stimulation even if she does not see any milk for now. Mom reports no questions at this time. Encouraged mom to ask for help as needed.   Maternal Data Has patient been taught Hand Expression?: Yes Does the patient have breastfeeding experience prior to this delivery?: Yes  Feeding Feeding Type: Breast Fed  LATCH Score Latch: Too sleepy or reluctant, no latch achieved, no sucking  elicited.  Audible Swallowing: None  Type of Nipple: Everted at rest and after stimulation  Comfort (Breast/Nipple): Soft / non-tender(large nipple difficult for baby to latch)  Hold (Positioning): Assistance needed to correctly position infant at breast and maintain latch.  LATCH Score: 5  Interventions Interventions: Breast feeding basics reviewed;DEBP  Lactation Tools Discussed/Used WIC Program: Yes   Consult Status Consult Status: Follow-up Date: 11/26/17 Follow-up type: In-patient    Gina Oconnor 11/25/2017, 3:25 PM

## 2017-11-26 LAB — CBC
HCT: 33.8 % — ABNORMAL LOW (ref 36.0–46.0)
Hemoglobin: 11.4 g/dL — ABNORMAL LOW (ref 12.0–15.0)
MCH: 30.3 pg (ref 26.0–34.0)
MCHC: 33.7 g/dL (ref 30.0–36.0)
MCV: 89.9 fL (ref 78.0–100.0)
PLATELETS: 282 10*3/uL (ref 150–400)
RBC: 3.76 MIL/uL — AB (ref 3.87–5.11)
RDW: 13.9 % (ref 11.5–15.5)
WBC: 10 10*3/uL (ref 4.0–10.5)

## 2017-11-26 LAB — COMPREHENSIVE METABOLIC PANEL
ALBUMIN: 2.7 g/dL — AB (ref 3.5–5.0)
ALT: 9 U/L — AB (ref 14–54)
AST: 21 U/L (ref 15–41)
Alkaline Phosphatase: 130 U/L — ABNORMAL HIGH (ref 38–126)
Anion gap: 9 (ref 5–15)
BUN: 10 mg/dL (ref 6–20)
CHLORIDE: 105 mmol/L (ref 101–111)
CO2: 25 mmol/L (ref 22–32)
CREATININE: 0.75 mg/dL (ref 0.44–1.00)
Calcium: 8.9 mg/dL (ref 8.9–10.3)
GFR calc Af Amer: >60 mL/min (ref >60)
GFR calc non Af Amer: >60 mL/min (ref >60)
GLUCOSE: 112 mg/dL — AB (ref 65–99)
POTASSIUM: 3.5 mmol/L (ref 3.5–5.1)
SODIUM: 139 mmol/L (ref 135–145)
Total Bilirubin: 0.2 mg/dL — ABNORMAL LOW (ref 0.3–1.2)
Total Protein: 6.7 g/dL (ref 6.5–8.1)

## 2017-11-26 LAB — PROTEIN / CREATININE RATIO, URINE
CREATININE, URINE: 25 mg/dL
Protein Creatinine Ratio: 0.28 mg/mg{Cre} — ABNORMAL HIGH (ref 0.00–0.15)
Total Protein, Urine: 7 mg/dL

## 2017-11-26 MED ORDER — AMLODIPINE BESYLATE 10 MG PO TABS
10.0000 mg | ORAL_TABLET | Freq: Every day | ORAL | Status: DC
  Administered 2017-11-26: 10 mg via ORAL
  Filled 2017-11-26 (×2): qty 1

## 2017-11-26 MED ORDER — BUTALBITAL-APAP-CAFFEINE 50-325-40 MG PO TABS
1.0000 | ORAL_TABLET | ORAL | Status: DC | PRN
  Administered 2017-11-26: 1 via ORAL
  Filled 2017-11-26: qty 1

## 2017-11-26 MED ORDER — AMLODIPINE BESYLATE 10 MG PO TABS: 10.0000 mg | ORAL_TABLET | Freq: Every day | ORAL | 0 refills | Status: DC

## 2017-11-26 MED ORDER — OXYCODONE-ACETAMINOPHEN 7.5-325 MG PO TABS
1.0000 | ORAL_TABLET | Freq: Once | ORAL | Status: AC
  Administered 2017-11-26: 1 via ORAL
  Filled 2017-11-26: qty 1

## 2017-11-26 NOTE — Progress Notes (Signed)
Discharge paperwork reviewed with patient via [place] interpreter. All questions answered.

## 2017-11-26 NOTE — Discharge Summary (Signed)
OB Discharge Summary     Patient Name: Genelle Balmira Benner DOB: 10/29/1971 MRN: [id number]  Date of admission: 11/24/2017 Delivering MD: Jacklyn ShellRESENZO-DISHMON, FRANCES   Date of discharge: 11/26/2017  Admitting diagnosis: 39WKS,WATER BROKE,BLEEDING Intrauterine pregnancy: 3258w6d     Secondary diagnosis:  Principal Problem:   SVD (spontaneous vaginal delivery) Active Problems:   Normal labor   Postpartum hypertension  Additional problems: GHTN w/o sever features/ranges     Discharge diagnosis: Term Pregnancy Delivered                                                                                                Post partum procedures:n/a  Augmentation: Pitocin  Complications: None  Hospital course:  Induction of Labor With Vaginal Delivery   46 y.o. yo [id number] at 5358w6d was admitted to the hospital 11/24/2017 for induction of labor.  Indication for induction: Gestational hypertension.  Patient had an uncomplicated labor course as follows: Membrane Rupture Time/Date: 12:00 AM ,11/24/2017   Intrapartum Procedures: Episiotomy: Median [2]                                         Lacerations:  1st degree [2]  Patient had delivery of a Viable infant.  Information for the patient's newborn:  Arbutus PedMohamed, Girl Simmone [[id number]][[id number]]  Delivery Method: Vaginal, Spontaneous(Filed from Delivery Summary)   11/24/2017  Details of delivery can be found in separate delivery note.  Patient had elevated blood pressures postartum (140s/90s). PIH labs were normal. She was started on amlodipine 10 mg daily, and discharged home with a Baby Love visit set up indication to 2-week PP visit follow up. Patient is discharged home 11/26/17.  Physical exam  Vitals:   11/26/17 0516 11/26/17 0900 11/26/17 1218 11/26/17 1450  BP: 129/76 (!) 146/94 133/78 129/79  Pulse: 63 89 73 75  Resp: 18 16 16 16   Temp: 98.1 F (36.7 C) 98.3 F (36.8 C) 98.2 F (36.8 C)   TempSrc: Oral Oral Oral   SpO2:  100% 100%   Weight:       Height:       General: alert, cooperative and no distress Lochia: appropriate Uterine Fundus: firm Incision: N/A DVT Evaluation: No evidence of DVT seen on physical exam. Labs: Lab Results  Component Value Date   WBC 10.0 11/26/2017   HGB 11.4 (L) 11/26/2017   HCT 33.8 (L) 11/26/2017   MCV 89.9 11/26/2017   PLT 282 11/26/2017   CMP Latest Ref Rng & Units 11/26/2017  Glucose 65 - 99 mg/dL 130(Q112(H)  BUN 6 - 20 mg/dL 10  Creatinine 6.570.44 - 8.461.00 mg/dL 9.620.75  Sodium 952135 - 841145 mmol/L 139  Potassium 3.5 - 5.1 mmol/L 3.5  Chloride 101 - 111 mmol/L 105  CO2 22 - 32 mmol/L 25  Calcium 8.9 - 10.3 mg/dL 8.9  Total Protein 6.5 - 8.1 g/dL 6.7  Total Bilirubin 0.3 - 1.2 mg/dL 3.2(G0.2(L)  Alkaline Phos 38 - 126 U/L 130(H)  AST 15 - 41 U/L 21  ALT  14 - 54 U/L 9(L)    Discharge instruction: per After Visit Summary and "Baby and Me Booklet".  After visit meds:  Allergies as of 11/26/2017   No Known Allergies     Medication List    TAKE these medications   amLODipine 10 MG tablet Commonly known as:  NORVASC Take 1 tablet (10 mg total) by mouth daily.   prenatal multivitamin Tabs tablet Take 1 tablet by mouth daily at 12 noon.   TYLENOL 325 MG tablet Generic drug:  acetaminophen Take 650 mg by mouth every 6 (six) hours as needed for mild pain.       Diet: routine diet  Activity: Advance as tolerated. Pelvic rest for 6 weeks.   Outpatient follow up: - Baby Love visit for BP check - PP visit in 2 weeks for BP check, and PPV in 4-6 weeks  Postpartum contraception: Undecided  Newborn Data: Live born female  Birth Weight: 6 lb 4.9 oz (2860 g) APGAR: 9, 9  Newborn Delivery   Birth date/time:  11/24/2017 20:46:00 Delivery type:  Vaginal, Spontaneous     Baby Feeding: Breast Disposition:home with mother   11/26/2017 Marthenia RollingScott Bland, DO  OB FELLOW DISCHARGE ATTESTATION  I have seen and examined this patient and agree with above documentation in the resident's note, with  modifications made as appropriate.  Frederik PearJulie P Dewitt Judice, MD OB Fellow

## 2017-11-26 NOTE — Lactation Note (Signed)
This note was copied from a baby's chart. Lactation Consultation Note  Patient Name: Gina Oconnor Today's Date: 11/26/2017   Baby at 37 hr of life. Upon entry RN was feeding bottle of formula and mom was in the shower. Pt expressed the desire to see lactation before d/c tomorrow with RN. Mom to call out at the next bf.    [name] 11/26/2017, 9:59 AM

## 2017-11-28 DIAGNOSIS — O165 Unspecified maternal hypertension, complicating the puerperium: Secondary | ICD-10-CM

## 2019-01-08 NOTE — Congregational Nurse Program (Signed)
   Ms Gina Oconnor came to the community center seeking birth control prescription. I have made an appointment with Center for [facility] for [place] [facility]th at 2:15 pm. She has been educated on the importance on annual mammogram and cervical cancer screening and will discuss with the Doctor during the appointment. Arman Bogus RN BSN PCCN  [phone]

## 2019-01-09 ENCOUNTER — Ambulatory Visit: Payer: BLUE CROSS/BLUE SHIELD | Admitting: Obstetrics & Gynecology

## 2019-01-16 ENCOUNTER — Telehealth: Payer: Self-pay

## 2019-01-16 NOTE — Congregational Nurse Program (Signed)
Ms Gina Oconnor came in to re-schedule appointment for birth control services. She missed previously scheduled due to insurance issues. I have called department of public health but the schedule for march is not available yet. Ms Gina Oconnor would like me to schedule with Family Practice medicine at [facility] [address] as advised by her insurance agency.Same  scheduled for February 17th at 1040am .Arman Bogus RN BSN PCCN [phone]

## 2019-01-16 NOTE — Telephone Encounter (Signed)
Ms Antigone came in requesting birth control services. [place] [facility] [place] [place] and the March calender is not open for me to make appointment. Ms Jorge Mandrilmira has Opted to go [facility].Appointment made for February 17th 2020 at 1040am. Arman Bogusorothy Muhoro RN BSN PCCN  [phone]

## 2019-02-12 ENCOUNTER — Telehealth: Payer: Self-pay

## 2019-02-12 NOTE — Congregational Nurse Program (Signed)
  Dept: [phone]   Congregational Nurse Program Note  Date of Encounter: 02/12/2019  Past Medical History: Past Medical History:  Diagnosis Date  . Anemia   . Sickle cell trait [facility])     Encounter Details: CNP Questionnaire - 02/12/19 1233      Questionnaire   Patient Status  Immigrant    Race  African    Location Patient Served At  [employer]    Uninsured  Not Applicable    Food  No food insecurities    Housing/Utilities  Yes, have permanent housing    Transportation  No transportation needs    Interpersonal Safety  Yes, feel physically and emotionally safe where you currently live    Medication  No medication insecurities    Medical Provider  Yes    Referrals  Not Applicable    ED Visit Averted  Not Applicable    Life-Saving Intervention Made  Not Applicable

## 2019-02-12 NOTE — Telephone Encounter (Signed)
Appointment made for March 12th 2020 at 1030am with OBGYN  No other concerns at this time.  Arman Bogus RN BSN PCCN  [phone]

## 2019-02-12 NOTE — Congregational Nurse Program (Signed)
Ms Derry in to make an appoint to see OBGYN, office is closed for lunch. She will be back later. She has no other concerns at this time. Arman Bogus RN BSN PCCN  [phone]

## 2019-11-21 DIAGNOSIS — Z139 Encounter for screening, unspecified: Secondary | ICD-10-CM

## 2019-11-21 LAB — GLUCOSE, POCT (MANUAL RESULT ENTRY): POC Glucose: 154 mg/dL — AB (ref 70–99)

## 2019-11-21 NOTE — Congregational Nurse Program (Signed)
  Dept: [place] Nurse Program Note  Date of Encounter: 11/21/2019  Past Medical History: No past medical history on file.  Encounter Details: CNP Questionnaire - 11/21/19 1402      Questionnaire   Patient Status  Refugee    Race  [place]    Uninsured  Not Applicable    Food  No food insecurities    Housing/Utilities  Yes, have permanent housing    Transportation  No transportation needs    Interpersonal Safety  Yes, feel physically and emotionally safe where you currently live    Medication  No medication insecurities    Medical Provider  No    ED Visit Averted  Not Applicable    Life-Saving Intervention Made  Not Applicable      Client came to clinic with her husband and 2 of her 6 children. She explained she had come to the clinic 2 months ago and her sugar was 108 not fasting. Today she has eaten bowl of fruit and a banana and whole milk. Her BS today is 154.Marland Kitchen She will plan to come back next week for fasting blood sugar check. BP 116/74. She does not have a PCP. Marguerite Olea, RN, [place] [phone]

## 2019-11-27 DIAGNOSIS — Z139 Encounter for screening, unspecified: Secondary | ICD-10-CM

## 2019-11-27 LAB — GLUCOSE, POCT (MANUAL RESULT ENTRY): POC Glucose: 100 mg/dl — AB (ref 70–99)

## 2019-11-27 NOTE — Congregational Nurse Program (Signed)
  Dept: [place] Nurse Program Note  Date of Encounter: 11/27/2019  Past Medical History: No past medical history on file.  Encounter Details:    Dept: [place] Nurse Program Note  Date of Encounter: 11/27/2019  Past Medical History: No past medical history on file.  Encounter Details: CNP Questionnaire - 11/27/19 1130      Questionnaire   Patient Status  Refugee    Race  [place]    Uninsured  Not Applicable    Food  No food insecurities    Housing/Utilities  Yes, have permanent housing    Transportation  No transportation needs    Interpersonal Safety  Yes, feel physically and emotionally safe where you currently live    Medication  No medication insecurities    Medical Provider  No    Referrals  Primary Care Provider/Clinic;Not Applicable    ED Visit Averted  Not Applicable    Life-Saving Intervention Made  Not Applicable       Client returned to get blood sugar checked. She came with her husband. Blood sugar today 100 and reports not eating this morning. Marguerite Olea, RN, BSN, CNP [phone]

## 2020-03-11 ENCOUNTER — Emergency Department (HOSPITAL_COMMUNITY)
Admission: EM | Admit: 2020-03-11 | Discharge: 2020-03-11 | Disposition: A | Discharge status: Home / Self Care | Payer: 59 | Attending: Emergency Medicine | Admitting: Emergency Medicine

## 2020-03-11 ENCOUNTER — Emergency Department (HOSPITAL_COMMUNITY): Payer: 59

## 2020-03-11 ENCOUNTER — Other Ambulatory Visit: Payer: Self-pay

## 2020-03-11 DIAGNOSIS — M545 Low back pain, unspecified: Secondary | ICD-10-CM

## 2020-03-11 DIAGNOSIS — R350 Frequency of micturition: Secondary | ICD-10-CM | POA: Insufficient documentation

## 2020-03-11 DIAGNOSIS — R1032 Left lower quadrant pain: Secondary | ICD-10-CM | POA: Diagnosis present

## 2020-03-11 DIAGNOSIS — D573 Sickle-cell trait: Secondary | ICD-10-CM | POA: Insufficient documentation

## 2020-03-11 LAB — CBC
HCT: 39.8 % (ref 36.0–46.0)
Hemoglobin: 13.4 g/dL (ref 12.0–15.0)
MCH: 28.6 pg (ref 26.0–34.0)
MCHC: 33.7 g/dL (ref 30.0–36.0)
MCV: 84.9 fL (ref 80.0–100.0)
Platelets: 396 10*3/uL (ref 150–400)
RBC: 4.69 MIL/uL (ref 3.87–5.11)
RDW: 14.3 % (ref 11.5–15.5)
WBC: 9.6 10*3/uL (ref 4.0–10.5)
nRBC: 0 % (ref 0.0–0.2)

## 2020-03-11 LAB — COMPREHENSIVE METABOLIC PANEL
ALT: 11 U/L (ref 0–44)
AST: 17 U/L (ref 15–41)
Albumin: 3.9 g/dL (ref 3.5–5.0)
Alkaline Phosphatase: 65 U/L (ref 38–126)
Anion gap: 10 (ref 5–15)
BUN: 12 mg/dL (ref 6–20)
CO2: 23 mmol/L (ref 22–32)
Calcium: 9.1 mg/dL (ref 8.9–10.3)
Chloride: 104 mmol/L (ref 98–111)
Creatinine, Ser: 0.7 mg/dL (ref 0.44–1.00)
GFR calc Af Amer: >60 mL/min (ref >60)
GFR calc non Af Amer: >60 mL/min (ref >60)
Glucose, Bld: 125 mg/dL — ABNORMAL HIGH (ref 70–99)
Potassium: 3.7 mmol/L (ref 3.5–5.1)
Sodium: 137 mmol/L (ref 135–145)
Total Bilirubin: 0.7 mg/dL (ref 0.3–1.2)
Total Protein: 7.9 g/dL (ref 6.5–8.1)

## 2020-03-11 LAB — URINALYSIS, ROUTINE W REFLEX MICROSCOPIC
Bilirubin Urine: NEGATIVE
Glucose, UA: NEGATIVE mg/dL
Ketones, ur: NEGATIVE mg/dL
Leukocytes,Ua: NEGATIVE
Nitrite: NEGATIVE
Protein, ur: NEGATIVE mg/dL
Specific Gravity, Urine: 1.013 (ref 1.005–1.030)
pH: 7 (ref 5.0–8.0)

## 2020-03-11 LAB — POC URINE PREG, ED: Preg Test, Ur: NEGATIVE

## 2020-03-11 LAB — LIPASE, BLOOD: Lipase: 25 U/L (ref 11–51)

## 2020-03-11 MED ORDER — SODIUM CHLORIDE 0.9% FLUSH: 3.0000 mL | Freq: Once | INTRAVENOUS | Status: DC

## 2020-03-11 MED ORDER — CYCLOBENZAPRINE HCL 10 MG PO TABS: 10.0000 mg | ORAL_TABLET | Freq: Two times a day (BID) | ORAL | 0 refills | Status: DC | PRN

## 2020-03-11 MED ORDER — KETOROLAC TROMETHAMINE 30 MG/ML IJ SOLN
30.0000 mg | Freq: Once | INTRAMUSCULAR | Status: AC
  Administered 2020-03-11: 30 mg via INTRAMUSCULAR
  Filled 2020-03-11: qty 1

## 2020-03-11 NOTE — Discharge Instructions (Signed)
Please pick up medication and take as prescribed. DO NOT DRIVE WHILE YOU ARE ON THIS MEDICINE AS IT CAN MAKE YOU DROWSY.   You can also take OTC Aleve as needed for pain. This is a good antiinflammatory.   Please follow up with your PCP. If you do not have one there is a [phone] number on the back of the discharge paperwork to help find one in your area.   Return to the ED for any worsening symptoms including worsening pain, vomiting, radiation of pain down your left leg, numbness in your groin, holding onto urine, peeing or pooping onto yourself, fevers > 100.4, or any other concerning symptoms.

## 2020-03-11 NOTE — ED Triage Notes (Signed)
Interpreter used-pt speaks Arabic-Pt here with L abdominal and flank pain since yesterday. Denies n/v/d or urinary symptoms. Last BM 1 hour PTA.

## 2020-03-11 NOTE — ED Provider Notes (Signed)
[place] EMERGENCY DEPARTMENT Provider Note   CSN: [id number] Arrival date & time: 03/11/20  1446     History No chief complaint on file.   Gina Oconnor is a 49 y.o. female with PMHx anemia and sickle cell trait who presents to the ED complaining of gradual onset, constant, sharp/stabbing, left flank pain x 1 day. Pt also complains of urinary frequency. No hx of kidney stones. She took Ibuprofen yesterday with mild relief. She does endorse exacerbation of pain with movement. Denies fevers, chills, nausea, vomiting, diarrhea, pelvic pain, or any other associated symptoms. LNMP 03/02  The history is provided by the patient and medical records. The history is limited by a language barrier.       Past Medical History:  Diagnosis Date  . Anemia   . Sickle cell trait [facility])     Patient Active Problem List   Diagnosis Date Noted  . SVD (spontaneous vaginal delivery) 11/28/2017  . Postpartum hypertension 11/28/2017  . Normal labor 11/24/2017    No past surgical history on file.   OB History    Gravida  6   Para  6   Term  6   Preterm      AB      Living  6     SAB      TAB      Ectopic      Multiple  0   Live Births  1           No family history on file.  Social History   Tobacco Use  . Smoking status: Never Smoker  . Smokeless tobacco: Never Used  Substance Use Topics  . Alcohol use: No  . Drug use: No    Home Medications Prior to Admission medications   Medication Sig Start Date End Date Taking? Authorizing Provider  ibuprofen (ADVIL) 200 MG tablet Take 200 mg by mouth as needed for mild pain or moderate pain.   Yes [provider]  amLODipine (NORVASC) 10 MG tablet Take 1 tablet (10 mg total) by mouth daily. Patient not taking: Reported on 03/11/2020 11/27/17   Sherene Sires, DO  cyclobenzaprine (FLEXERIL) 10 MG tablet Take 1 tablet (10 mg total) by mouth 2 (two) times daily as needed for muscle spasms. 03/11/20    Eustaquio Maize, PA-C    Allergies    Patient has no known allergies.  Review of Systems   Review of Systems  Constitutional: Negative for chills and fever.  Gastrointestinal: Negative for abdominal pain, diarrhea, nausea and vomiting.  Genitourinary: Positive for flank pain and frequency. Negative for dysuria and pelvic pain.  All other systems reviewed and are negative.   Physical Exam Updated Vital Signs BP (!) 166/107 (BP Location: Right Arm)   Pulse 97   Temp 98.5 F (36.9 C) (Oral)   Resp 16   Ht 5' (1.524 m)   Wt 64.4 kg   LMP 02/12/2020 (Exact Date)   SpO2 100%   BMI 27.73 kg/m   Physical Exam Vitals and nursing note reviewed.  Constitutional:      Appearance: She is not ill-appearing or diaphoretic.  HENT:     Head: Normocephalic and atraumatic.  Eyes:     Conjunctiva/sclera: Conjunctivae normal.  Cardiovascular:     Rate and Rhythm: Normal rate and regular rhythm.     Pulses: Normal pulses.  Pulmonary:     Effort: Pulmonary effort is normal.     Breath sounds: Normal breath sounds.  No wheezing, rhonchi or rales.  Abdominal:     Palpations: Abdomen is soft.     Tenderness: There is no abdominal tenderness. There is left CVA tenderness. There is no right CVA tenderness, guarding or rebound.  Musculoskeletal:     Cervical back: Neck supple.     Comments: No C, T, or L midline spinal tenderness. + Paralumbar spinal muscle TTP. ROM intact to neck and back. Strength 5/5 to BUE and BLEs. 2+ distal pulses.   Skin:    General: Skin is warm and dry.  Neurological:     Mental Status: She is alert.     ED Results / Procedures / Treatments   Labs (all labs ordered are listed, but only abnormal results are displayed) Labs Reviewed  COMPREHENSIVE METABOLIC PANEL - Abnormal; Notable for the following components:      Result Value   Glucose, Bld 125 (*)    All other components within normal limits  URINALYSIS, ROUTINE W REFLEX MICROSCOPIC - Abnormal; Notable  for the following components:   APPearance HAZY (*)    Hgb urine dipstick SMALL (*)    Bacteria, UA RARE (*)    All other components within normal limits  LIPASE, BLOOD  CBC  I-STAT BETA HCG BLOOD, ED ([facility], [facility], [facility] ONLY)  POC URINE PREG, ED    EKG None  Radiology CT Renal Stone Study  Result Date: 03/11/2020 CLINICAL DATA:  Left flank pain. EXAM: CT ABDOMEN AND PELVIS WITHOUT CONTRAST TECHNIQUE: Multidetector CT imaging of the abdomen and pelvis was performed following the standard protocol without IV contrast. COMPARISON:  None. FINDINGS: Lower chest: Hypoventilatory changes at the bases. Hepatobiliary: No focal liver abnormality is seen. No gallstones, gallbladder wall thickening, or biliary dilatation. Pancreas: No ductal dilatation or inflammation. Spleen: Normal in size without focal abnormality. Adrenals/Urinary Tract: Normal adrenal glands. No hydronephrosis. No perinephric edema. No renal or ureteral calculi. Both ureters are decompressed without stones along the course. Urinary bladder is partially distended. No bladder stone. No bladder wall thickening. Stomach/Bowel: Nondistended stomach. Normal positioning of the ligament of Treitz. No small bowel obstruction or inflammation. Normal appendix. Transverse, descending, and sigmoid colon are tortuous with redundancy. Moderate stool burden in the colon. No colonic wall thickening or inflammation. Vascular/Lymphatic: Abdominal aorta is normal in caliber. No bulky abdominopelvic adenopathy. Reproductive: Uterus and bilateral adnexa are unremarkable. Other: No free air, free fluid, or intra-abdominal fluid collection. Slight rectus diastasis. Musculoskeletal: There are no acute or suspicious osseous abnormalities. Mild degenerative change of both sacroiliac joints. IMPRESSION: 1. No renal stones or obstructive uropathy. No acute abnormality in the abdomen/pelvis. 2. Moderate stool burden in the colon with colonic redundancy, can be seen with  constipation. Electronically Signed   By: Narda Rutherford M.D.   On: 03/11/2020 20:20    Procedures Procedures (including critical care time)  Medications Ordered in ED Medications  sodium chloride flush (NS) 0.9 % injection 3 mL (3 mLs Intravenous Not Given 03/11/20 1853)  ketorolac (TORADOL) 30 MG/ML injection 30 mg (30 mg Intramuscular Given 03/11/20 1852)    ED Course  I have reviewed the triage vital signs and the nursing notes.  Pertinent labs & imaging results that were available during my care of the patient were reviewed by me and considered in my medical decision making (see chart for details).    MDM Rules/Calculators/A&P                      49 year old Arabic speaking  female who presents to the ED today complaining of left flank pain that started yesterday with associated urinary frequency.  On arrival to the ED patient is afebrile, nontachycardic and nontachypneic.  Blood pressure elevated at 166/107, no history of high blood pressure.  Suspect this is due to pain.  She has left CVA tenderness on exam over this appears to extend down to the paralumbar spinal muscles as well.  And whether she is having pain related to kidney stone versus musculoskeletal type pain.  However given urinary frequency as well will work-up for flank pain initially.  No abdominal tenderness or peritoneal signs.  Lab work was obtained while patient was in the waiting room for flank pain.  CBC without leukocytosis.  Hemoglobin stable. BMP with glucose 125.  Creatinine stable 0.70.  No other electrolyte abnormalities.  No LFT elevations. Lipase 25.  UA hazy in appearance as well as small hemoglobin and 6-10 red blood cells per high-power field.  No infection.  Last normal menstrual period 03/02.  Denies menses currently.  Pregnancy negative.  Will obtain CT renal stone study due to left flank pain and urinary frequency.  Patient without history of kidney stones.  Will assess for hydro-.  Toradol given  for pain.   CT scan without any signs of acute findings.  Shows some constipation.  Patient advised for MiraLAX outpatient.   On reeval patient states her pain is improved with Toradol.  She does mention after full work-up that she has a disabled son and she lifted him out of his wheelchair yesterday prompting pain.  Treat for musculoskeletal type pain.  Patient given Flexeril.  Had initially ordered naproxen however patient's insurance prompts in epic that they do not cover it.  Have advised naproxen over-the-counter for pain relief.  Patient advised to follow-up with PCP.  Strict return precautions have been discussed.  Patient is in agreement with plan and stable for discharge home.   This note was prepared using Dragon voice recognition software and may include unintentional dictation errors due to the inherent limitations of voice recognition software.  Final Clinical Impression(s) / ED Diagnoses Final diagnoses:  Acute left-sided low back pain without sciatica    Rx / DC Orders ED Discharge Orders         Ordered    cyclobenzaprine (FLEXERIL) 10 MG tablet  2 times daily PRN     03/11/20 2036           Discharge Instructions     Please pick up medication and take as prescribed. DO NOT DRIVE WHILE YOU ARE ON THIS MEDICINE AS IT CAN MAKE YOU DROWSY.   You can also take OTC Aleve as needed for pain. This is a good antiinflammatory.   Please follow up with your PCP. If you do not have one there is a 1800 number on the back of the discharge paperwork to help find one in your area.   Return to the ED for any worsening symptoms including worsening pain, vomiting, radiation of pain down your left leg, numbness in your groin, holding onto urine, peeing or pooping onto yourself, fevers > 100.4, or any other concerning symptoms.        Tanda Rockers, PA-C 03/11/20 2120    Eber Hong, MD 03/12/20 [phone]

## 2020-03-11 NOTE — ED Notes (Signed)
ED Provider at bedside. 

## 2020-05-26 ENCOUNTER — Ambulatory Visit: Payer: 59 | Attending: Family Medicine | Admitting: Family Medicine

## 2020-05-26 ENCOUNTER — Other Ambulatory Visit: Payer: Self-pay

## 2020-05-26 ENCOUNTER — Encounter: Payer: Self-pay | Admitting: Family Medicine

## 2020-05-26 VITALS — BP 106/69 | HR 75 | Ht 60.0 in | Wt 138.8 lb

## 2020-05-26 DIAGNOSIS — R5383 Other fatigue: Secondary | ICD-10-CM | POA: Insufficient documentation

## 2020-05-26 DIAGNOSIS — Z131 Encounter for screening for diabetes mellitus: Secondary | ICD-10-CM

## 2020-05-26 DIAGNOSIS — Z79899 Other long term (current) drug therapy: Secondary | ICD-10-CM | POA: Insufficient documentation

## 2020-05-26 DIAGNOSIS — D573 Sickle-cell trait: Secondary | ICD-10-CM | POA: Insufficient documentation

## 2020-05-26 DIAGNOSIS — Z1159 Encounter for screening for other viral diseases: Secondary | ICD-10-CM | POA: Diagnosis not present

## 2020-05-26 DIAGNOSIS — R002 Palpitations: Secondary | ICD-10-CM | POA: Insufficient documentation

## 2020-05-26 DIAGNOSIS — Z791 Long term (current) use of non-steroidal anti-inflammatories (NSAID): Secondary | ICD-10-CM | POA: Insufficient documentation

## 2020-05-26 LAB — POCT GLYCOSYLATED HEMOGLOBIN (HGB A1C): HbA1c, POC (prediabetic range): 5.8 % (ref 5.7–6.4)

## 2020-05-26 NOTE — Progress Notes (Signed)
Patient would like to be screened for Diabetes.

## 2020-05-26 NOTE — Progress Notes (Signed)
Subjective:  Patient ID: Gina Oconnor, female    DOB: 12-30-1970  Age: 49 y.o. MRN: [id number]  CC: New Patient (Initial Visit)   HPI Gina Oconnor is a 49 year old female who presents today to establish care.  Her med list reveals prescription of amlodipine from 2018 however she denies a history of hypertension and has not been on any medications chronically. She would like to be screened for diabetes mellitus today. She complains of fatigue even after a good nights rest and review of labs from 02/2020 revealed a normal hemoglobin.  Sometimes endorses palpitations but has none at this time.  She has lost about 6 pounds but is unable to give a specific timeframe under which this happened. Past Medical History:  Diagnosis Date  . Anemia   . Sickle cell trait ([place])     No past surgical history on file.  No family history on file.  No Known Allergies  Outpatient Medications Prior to Visit  Medication Sig Dispense Refill  . cyclobenzaprine (FLEXERIL) 10 MG tablet Take 1 tablet (10 mg total) by mouth 2 (two) times daily as needed for muscle spasms. (Patient not taking: Reported on 05/26/2020) 20 tablet 0  . ibuprofen (ADVIL) 200 MG tablet Take 200 mg by mouth as needed for mild pain or moderate pain. (Patient not taking: Reported on 05/26/2020)    . amLODipine (NORVASC) 10 MG tablet Take 1 tablet (10 mg total) by mouth daily. (Patient not taking: Reported on 03/11/2020) 30 tablet 0   No facility-administered medications prior to visit.     ROS Review of Systems  Constitutional: Negative for activity change, appetite change and fatigue.  HENT: Negative for congestion, sinus pressure and sore throat.   Eyes: Negative for visual disturbance.  Respiratory: Negative for cough, chest tightness, shortness of breath and wheezing.   Cardiovascular: Negative for chest pain and palpitations.  Gastrointestinal: Negative for abdominal distention, abdominal pain and constipation.  Endocrine:  Negative for polydipsia.  Genitourinary: Negative for dysuria and frequency.  Musculoskeletal: Negative for arthralgias and back pain.  Skin: Negative for rash.  Neurological: Negative for tremors, light-headedness and numbness.  Hematological: Does not bruise/bleed easily.  Psychiatric/Behavioral: Negative for agitation and behavioral problems.    Objective:  BP 106/69   Pulse 75   Ht 5' (1.524 m)   Wt 138 lb 12.8 oz (63 kg)   SpO2 100%   BMI 27.11 kg/m   BP/Weight 05/26/2020 6/76/7209 03/19/961  Systolic BP 836 629 476  Diastolic BP 69 72 80  Wt. (Lbs) 138.8 142 -  BMI 27.11 27.73 -      Physical Exam Constitutional:      Appearance: She is well-developed.  Neck:     Vascular: No JVD.  Cardiovascular:     Rate and Rhythm: Normal rate.     Heart sounds: Normal heart sounds. No murmur heard.   Pulmonary:     Effort: Pulmonary effort is normal.     Breath sounds: Normal breath sounds. No wheezing or rales.  Chest:     Chest wall: No tenderness.  Abdominal:     General: Bowel sounds are normal. There is no distension.     Palpations: Abdomen is soft. There is no mass.     Tenderness: There is no abdominal tenderness.  Musculoskeletal:        General: Normal range of motion.     Right lower leg: No edema.     Left lower leg: No edema.  Neurological:  Mental Status: She is alert and oriented to person, place, and time.  Psychiatric:        Mood and Affect: Mood normal.     CMP Latest Ref Rng & Units 03/11/2020 11/26/2017 11/24/2017  Glucose 70 - 99 mg/dL 277(O) 242(P) 81  BUN 6 - 20 mg/dL 12 10 7   Creatinine 0.44 - 1.00 mg/dL 5.36 1.44  Sodium 135 - 145 mmol/L 137 139 134(L)  Potassium 3.5 - 5.1 mmol/L 3.7 3.5 3.8  Chloride 98 - 111 mmol/L 104 105 105  CO2 22 - 32 mmol/L 23 25 19(L)  Calcium 8.9 - 10.3 mg/dL 9.1 8.9 3.15)  Total Protein 6.5 - 8.1 g/dL 7.9 6.7 6.8  Total Bilirubin 0.3 - 1.2 mg/dL 0.7 4.0(G) 0.5  Alkaline Phos 38 - 126 U/L 65 130(H)  163(H)  AST 15 - 41 U/L 17 21 20   ALT 0 - 44 U/L 11 9(L) 9(L)    Lipid Panel  No results found for: CHOL, TRIG, HDL, CHOLHDL, VLDL, LDLCALC, LDLDIRECT  CBC    Component Value Date/Time   WBC 9.6 03/11/2020 1503   RBC 4.69 03/11/2020 1503   HGB 13.4 03/11/2020 1503   HCT 39.8 03/11/2020 1503   PLT 396 03/11/2020 1503   MCV 84.9 03/11/2020 1503   MCH 28.6 03/11/2020 1503   MCHC 33.7 03/11/2020 1503   RDW 14.3 03/11/2020 1503    Lab Results  Component Value Date   HGBA1C 5.8 05/26/2020    Assessment & Plan:  1. Other fatigue Normal CBC We will send of thyroid panel - T4, free - TSH  2. Screening for diabetes mellitus A1c is 5.8 - POCT glycosylated hemoglobin (Hb A1C)  3. Need for hepatitis C screening test - HCV RNA quant rflx ultra or genotyp([facility])  4. Palpitation EKG reveals normal sinus rhythm We will await thyroid labs  Return in about 1 month (around 06/25/2020) for Complete physical exam.     05/28/2020, MD, FAAFP. [facility] [place], [facility] [place]   05/26/2020, 9:00 AM

## 2020-05-26 NOTE — Patient Instructions (Signed)

## 2020-05-27 LAB — T4, FREE: Free T4: 1.03 ng/dL (ref 0.82–1.77)

## 2020-05-27 LAB — HCV RNA QUANT RFLX ULTRA OR GENOTYP: HCV Quant Baseline: NOT DETECTED IU/mL

## 2020-05-27 LAB — TSH: TSH: 1.63 u[IU]/mL (ref 0.450–4.500)

## 2020-05-28 ENCOUNTER — Telehealth: Payer: Self-pay

## 2020-05-28 NOTE — Telephone Encounter (Signed)
-----   Message from Hoy Register, MD sent at 05/28/2020 12:25 PM EDT ----- Please inform the patient that labs are normal. Thank you.

## 2020-05-28 NOTE — Telephone Encounter (Signed)
Patient name and DOB has been verified Patient was informed of lab results. Patient had no questions.  

## 2020-07-03 ENCOUNTER — Encounter: Payer: Self-pay | Admitting: Family Medicine

## 2020-07-03 ENCOUNTER — Other Ambulatory Visit: Payer: Self-pay

## 2020-07-03 ENCOUNTER — Ambulatory Visit: Payer: 59 | Attending: Family Medicine | Admitting: Family Medicine

## 2020-07-03 VITALS — BP 105/70 | HR 77 | Ht 60.0 in | Wt 138.8 lb

## 2020-07-03 DIAGNOSIS — Z1231 Encounter for screening mammogram for malignant neoplasm of breast: Secondary | ICD-10-CM

## 2020-07-03 DIAGNOSIS — Z1159 Encounter for screening for other viral diseases: Secondary | ICD-10-CM

## 2020-07-03 DIAGNOSIS — Z0000 Encounter for general adult medical examination without abnormal findings: Secondary | ICD-10-CM

## 2020-07-03 NOTE — Patient Instructions (Signed)
Health Maintenance, Female Adopting a healthy lifestyle and getting preventive care are important in promoting health and wellness. Ask your health care provider about:  The right schedule for you to have regular tests and exams.  Things you can do on your own to prevent diseases and keep yourself healthy. What should I know about diet, weight, and exercise? Eat a healthy diet   Eat a diet that includes plenty of vegetables, fruits, low-fat dairy products, and lean protein.  Do not eat a lot of foods that are high in solid fats, added sugars, or sodium. Maintain a healthy weight Body mass index (BMI) is used to identify weight problems. It estimates body fat based on height and weight. Your health care provider can help determine your BMI and help you achieve or maintain a healthy weight. Get regular exercise Get regular exercise. This is one of the most important things you can do for your health. Most adults should:  Exercise for at least 150 minutes each week. The exercise should increase your heart rate and make you sweat (moderate-intensity exercise).  Do strengthening exercises at least twice a week. This is in addition to the moderate-intensity exercise.  Spend less time sitting. Even light physical activity can be beneficial. Watch cholesterol and blood lipids Have your blood tested for lipids and cholesterol at 47 years of age, then have this test every 5 years. Have your cholesterol levels checked more often if:  Your lipid or cholesterol levels are high.  You are older than 47 years of age.  You are at high risk for heart disease. What should I know about cancer screening? Depending on your health history and family history, you may need to have cancer screening at various ages. This may include screening for:  Breast cancer.  Cervical cancer.  Colorectal cancer.  Skin cancer.  Lung cancer. What should I know about heart disease, diabetes, and high blood  pressure? Blood pressure and heart disease  High blood pressure causes heart disease and increases the risk of stroke. This is more likely to develop in people who have high blood pressure readings, are of African descent, or are overweight.  Have your blood pressure checked: ? Every 3-5 years if you are 18-39 years of age. ? Every year if you are 40 years old or older. Diabetes Have regular diabetes screenings. This checks your fasting blood sugar level. Have the screening done:  Once every three years after age 40 if you are at a normal weight and have a low risk for diabetes.  More often and at a younger age if you are overweight or have a high risk for diabetes. What should I know about preventing infection? Hepatitis B If you have a higher risk for hepatitis B, you should be screened for this virus. Talk with your health care provider to find out if you are at risk for hepatitis B infection. Hepatitis C Testing is recommended for:  Everyone born from 1945 through 1965.  Anyone with known risk factors for hepatitis C. Sexually transmitted infections (STIs)  Get screened for STIs, including gonorrhea and chlamydia, if: ? You are sexually active and are younger than 47 years of age. ? You are older than 47 years of age and your health care provider tells you that you are at risk for this type of infection. ? Your sexual activity has changed since you were last screened, and you are at increased risk for chlamydia or gonorrhea. Ask your health care provider if   you are at risk.  Ask your health care provider about whether you are at high risk for HIV. Your health care provider may recommend a prescription medicine to help prevent HIV infection. If you choose to take medicine to prevent HIV, you should first get tested for HIV. You should then be tested every 3 months for as long as you are taking the medicine. Pregnancy  If you are about to stop having your period (premenopausal) and  you may become pregnant, seek counseling before you get pregnant.  Take 400 to 800 micrograms (mcg) of folic acid every day if you become pregnant.  Ask for birth control (contraception) if you want to prevent pregnancy. Osteoporosis and menopause Osteoporosis is a disease in which the bones lose minerals and strength with aging. This can result in bone fractures. If you are 65 years old or older, or if you are at risk for osteoporosis and fractures, ask your health care provider if you should:  Be screened for bone loss.  Take a calcium or vitamin D supplement to lower your risk of fractures.  Be given hormone replacement therapy (HRT) to treat symptoms of menopause. Follow these instructions at home: Lifestyle  Do not use any products that contain nicotine or tobacco, such as cigarettes, e-cigarettes, and chewing tobacco. If you need help quitting, ask your health care provider.  Do not use street drugs.  Do not share needles.  Ask your health care provider for help if you need support or information about quitting drugs. Alcohol use  Do not drink alcohol if: ? Your health care provider tells you not to drink. ? You are pregnant, may be pregnant, or are planning to become pregnant.  If you drink alcohol: ? Limit how much you use to 0-1 drink a day. ? Limit intake if you are breastfeeding.  Be aware of how much alcohol is in your drink. In the [place] one drink equals one 12 oz bottle of beer (355 mL), one 5 oz glass of wine (148 mL), or one 1 oz glass of hard liquor (44 mL). General instructions  Schedule regular health, dental, and eye exams.  Stay current with your vaccines.  Tell your health care provider if: ? You often feel depressed. ? You have ever been abused or do not feel safe at home. Summary  Adopting a healthy lifestyle and getting preventive care are important in promoting health and wellness.  Follow your health care provider's instructions about healthy  diet, exercising, and getting tested or screened for diseases.  Follow your health care provider's instructions on monitoring your cholesterol and blood pressure. This information is not intended to replace advice given to you by your health care provider. Make sure you discuss any questions you have with your health care provider. Document Revised: 11/22/2018 Document Reviewed: 11/22/2018 Elsevier Patient Education  2020 Elsevier Inc.  

## 2020-07-03 NOTE — Progress Notes (Signed)
Subjective:  Patient ID: Gina Oconnor, female    DOB: [birth year]  Age: 49 y.o. MRN: [id number]  CC: Annual Exam   HPI [name] presents for a complete physical exam.  She is due for a Pap smear and a mammogram. She is currently on her monthly cycle.  Past Medical History:  Diagnosis Date  . Anemia   . Sickle cell trait ([facility])     History reviewed. No pertinent surgical history.  History reviewed. No pertinent family history.  No Known Allergies  Outpatient Medications Prior to Visit  Medication Sig Dispense Refill  . cyclobenzaprine (FLEXERIL) 10 MG tablet Take 1 tablet (10 mg total) by mouth 2 (two) times daily as needed for muscle spasms. (Patient not taking: Reported on 05/26/2020) 20 tablet 0  . ibuprofen (ADVIL) 200 MG tablet Take 200 mg by mouth as needed for mild pain or moderate pain. (Patient not taking: Reported on 05/26/2020)     No facility-administered medications prior to visit.     ROS Review of Systems  Constitutional: Negative for activity change, appetite change and fatigue.  HENT: Negative for congestion, sinus pressure and sore throat.   Eyes: Negative for visual disturbance.  Respiratory: Negative for cough, chest tightness, shortness of breath and wheezing.   Cardiovascular: Negative for chest pain and palpitations.  Gastrointestinal: Negative for abdominal distention, abdominal pain and constipation.  Endocrine: Negative for polydipsia.  Genitourinary: Negative for dysuria and frequency.  Musculoskeletal: Negative for arthralgias and back pain.  Skin: Negative for rash.  Neurological: Negative for tremors, light-headedness and numbness.  Hematological: Does not bruise/bleed easily.  Psychiatric/Behavioral: Negative for agitation and behavioral problems.    Objective:  BP 105/70   Pulse 77   Ht 5' (1.524 m)   Wt 138 lb 12.8 oz (63 kg)   SpO2 100%   BMI 27.11 kg/m   BP/Weight 07/03/2020 05/26/2020 03/11/2020  Systolic BP 105 106 129    Diastolic BP 70 69 72  Wt. (Lbs) 138.8 138.8 142  BMI 27.11 27.11 27.73      Physical Exam Constitutional:      General: She is not in acute distress.    Appearance: She is well-developed. She is not diaphoretic.  HENT:     Head: Normocephalic.     Right Ear: External ear normal.     Left Ear: External ear normal.     Nose: Nose normal.  Eyes:     Conjunctiva/sclera: Conjunctivae normal.     Pupils: Pupils are equal, round, and reactive to light.  Neck:     Vascular: No JVD.  Cardiovascular:     Rate and Rhythm: Normal rate and regular rhythm.     Heart sounds: Normal heart sounds. No murmur heard.  No gallop.   Pulmonary:     Effort: Pulmonary effort is normal. No respiratory distress.     Breath sounds: Normal breath sounds. No wheezing or rales.  Chest:     Chest wall: No tenderness.     Breasts:        Right: No mass, skin change or tenderness.        Left: No mass, skin change or tenderness.  Abdominal:     General: Bowel sounds are normal. There is no distension.     Palpations: Abdomen is soft. There is no mass.     Tenderness: There is no abdominal tenderness.  Musculoskeletal:        General: No tenderness. Normal range of motion.  Cervical back: Normal range of motion.  Lymphadenopathy:     Cervical: No cervical adenopathy.     Upper Body:     Right upper body: No supraclavicular, axillary or pectoral adenopathy.     Left upper body: No supraclavicular, axillary or pectoral adenopathy.  Skin:    General: Skin is warm and dry.  Neurological:     Mental Status: She is alert and oriented to person, place, and time.     Deep Tendon Reflexes: Reflexes are normal and symmetric.     CMP Latest Ref Rng & Units 03/11/2020 11/26/2017 11/24/2017  Glucose 70 - 99 mg/dL 454(U) 981(X) 81  BUN 6 - 20 mg/dL 12 10 7   Creatinine 0.44 - 1.00 mg/dL 9.14 7.82  Sodium 135 - 145 mmol/L 137 139 134(L)  Potassium 3.5 - 5.1 mmol/L 3.7 3.5 3.8  Chloride 98 - 111  mmol/L 104 105 105  CO2 22 - 32 mmol/L 23 25 19(L)  Calcium 8.9 - 10.3 mg/dL 9.1 8.9 9.56)  Total Protein 6.5 - 8.1 g/dL 7.9 6.7 6.8  Total Bilirubin 0.3 - 1.2 mg/dL 0.7 2.1(H) 0.5  Alkaline Phos 38 - 126 U/L 65 130(H) 163(H)  AST 15 - 41 U/L 17 21 20   ALT 0 - 44 U/L 11 9(L) 9(L)    Lipid Panel  No results found for: CHOL, TRIG, HDL, CHOLHDL, VLDL, LDLCALC, LDLDIRECT  CBC    Component Value Date/Time   WBC 9.6 03/11/2020 1503   RBC 4.69 03/11/2020 1503   HGB 13.4 03/11/2020 1503   HCT 39.8 03/11/2020 1503   PLT 396 03/11/2020 1503   MCV 84.9 03/11/2020 1503   MCH 28.6 03/11/2020 1503   MCHC 33.7 03/11/2020 1503   RDW 14.3 03/11/2020 1503    Lab Results  Component Value Date   HGBA1C 5.8 05/26/2020    Assessment & Plan:  1. Annual physical exam Counseled on 150 minutes of exercise per week, healthy eating (including decreased daily intake of saturated fats, cholesterol, added sugars, sodium), routine healthcare maintenance. - LP+Non-HDL Cholesterol  2. Encounter for screening mammogram for malignant neoplasm of breast - MM 3D SCREEN BREAST BILATERAL; Future  3. Need for hepatitis C screening test - HCV RNA quant rflx ultra or genotyp([facility])   Follow-up: Return in about 6 weeks (around 08/14/2020) for PAP smear.       05/28/2020, MD, FAAFP. [facility] [place], [facility] [place]   07/03/2020, 10:46 AM

## 2020-07-04 LAB — LP+NON-HDL CHOLESTEROL
Cholesterol, Total: 210 mg/dL — ABNORMAL HIGH (ref 100–199)
HDL: 43 mg/dL (ref >39)
LDL Chol Calc (NIH): 150 mg/dL — ABNORMAL HIGH (ref 0–99)
Total Non-HDL-Chol (LDL+VLDL): 167 mg/dL — ABNORMAL HIGH (ref 0–129)
Triglycerides: 95 mg/dL (ref 0–149)
VLDL Cholesterol Cal: 17 mg/dL (ref 5–40)

## 2020-07-04 LAB — HCV RNA QUANT RFLX ULTRA OR GENOTYP: HCV Quant Baseline: NOT DETECTED IU/mL

## 2020-07-09 ENCOUNTER — Ambulatory Visit: Payer: 59 | Attending: Internal Medicine

## 2020-07-09 DIAGNOSIS — Z23 Encounter for immunization: Secondary | ICD-10-CM

## 2020-07-09 NOTE — Progress Notes (Signed)
   Covid-19 Vaccination Clinic  Name:  Gina Oconnor    MRN: [id number] DOB: Dec 16, 1970  07/09/2020  [name] was observed post Covid-19 immunization for 15 minutes without incident. She was provided with Vaccine Information Sheet and instruction to access the V-Safe system.   [name] was instructed to call 911 with any severe reactions post vaccine: Marland Kitchen Difficulty breathing  . Swelling of face and throat  . A fast heartbeat  . A bad rash all over body  . Dizziness and weakness   Immunizations Administered    Name Date Dose VIS Date Route   Pfizer COVID-19 Vaccine 07/09/2020  1:30 PM 0.3 mL 02/06/2019 Intramuscular   Manufacturer: [employer], [employer]   Lot: [id number]   NDC: [address]-0

## 2020-07-10 ENCOUNTER — Telehealth: Payer: Self-pay

## 2020-07-10 NOTE — Telephone Encounter (Signed)
Patient was called and a voicemail was left informing patient to return phone call. 

## 2020-07-10 NOTE — Telephone Encounter (Signed)
-----   Message from Hoy Register, MD sent at 07/05/2020 11:07 PM EDT ----- Cholesterol is slightly elevated. Due to her low cholesterol risk medication is not indicated at this time but a low cholesterol diet and exercise

## 2020-07-18 ENCOUNTER — Telehealth: Payer: Self-pay

## 2020-07-18 NOTE — Telephone Encounter (Signed)
Patient name and DOB has been verified Patient was informed of lab results. Patient had no questions.  

## 2020-07-18 NOTE — Telephone Encounter (Signed)
-----   Message from Enobong Newlin, MD sent at 07/05/2020 11:07 PM EDT ----- Cholesterol is slightly elevated. Due to her low cholesterol risk medication is not indicated at this time but a low cholesterol diet and exercise 

## 2020-07-24 ENCOUNTER — Ambulatory Visit: Payer: 59

## 2020-08-05 ENCOUNTER — Ambulatory Visit: Payer: 59 | Attending: Internal Medicine

## 2020-08-05 DIAGNOSIS — Z23 Encounter for immunization: Secondary | ICD-10-CM

## 2020-08-05 NOTE — Progress Notes (Signed)
   Covid-19 Vaccination Clinic  Name:  Gina Oconnor    MRN: [id number] DOB: 1971-11-07  08/05/2020  Gina Oconnor was observed post Covid-19 immunization for 15 minutes without incident. She was provided with Vaccine Information Sheet and instruction to access the V-Safe system.   Gina Oconnor was instructed to call 911 with any severe reactions post vaccine: Marland Kitchen Difficulty breathing  . Swelling of face and throat  . A fast heartbeat  . A bad rash all over body  . Dizziness and weakness   Immunizations Administered    Name Date Dose VIS Date Route   Pfizer COVID-19 Vaccine 08/05/2020  1:41 PM 0.3 mL 02/06/2019 Intramuscular   Manufacturer: [employer], [employer]   Lot: [id number]   NDC: [address]-9

## 2020-08-06 ENCOUNTER — Ambulatory Visit: Payer: 59

## 2020-08-20 ENCOUNTER — Ambulatory Visit: Payer: 59 | Attending: Family Medicine | Admitting: Family Medicine

## 2020-08-20 ENCOUNTER — Encounter: Payer: Self-pay | Admitting: Family Medicine

## 2020-08-20 ENCOUNTER — Other Ambulatory Visit (HOSPITAL_COMMUNITY)
Admission: RE | Admit: 2020-08-20 | Discharge: 2020-08-20 | Disposition: A | Discharge status: Home / Self Care | Payer: 59 | Source: Ambulatory Visit | Attending: Family Medicine | Admitting: Family Medicine

## 2020-08-20 ENCOUNTER — Other Ambulatory Visit: Payer: Self-pay

## 2020-08-20 VITALS — BP 104/66 | HR 86 | Ht 60.0 in | Wt 140.0 lb

## 2020-08-20 DIAGNOSIS — Z23 Encounter for immunization: Secondary | ICD-10-CM | POA: Diagnosis not present

## 2020-08-20 DIAGNOSIS — Z113 Encounter for screening for infections with a predominantly sexual mode of transmission: Secondary | ICD-10-CM | POA: Insufficient documentation

## 2020-08-20 DIAGNOSIS — Z124 Encounter for screening for malignant neoplasm of cervix: Secondary | ICD-10-CM | POA: Diagnosis present

## 2020-08-20 NOTE — Patient Instructions (Signed)

## 2020-08-20 NOTE — Progress Notes (Signed)
Subjective:  Patient ID: Gina Oconnor, female    DOB: 1971-06-26  Age: 49 y.o. MRN: [id number]  CC: Gynecologic Exam   HPI Gina Oconnor presents for Pap smear.  She had her annual physical exam at her last visit but at that time was on her monthly cycle.  She is also requesting an STD test.  Past Medical History:  Diagnosis Date  . Anemia   . Sickle cell trait ([facility])     History reviewed. No pertinent surgical history.  History reviewed. No pertinent family history.  No Known Allergies  Outpatient Medications Prior to Visit  Medication Sig Dispense Refill  . cyclobenzaprine (FLEXERIL) 10 MG tablet Take 1 tablet (10 mg total) by mouth 2 (two) times daily as needed for muscle spasms. (Patient not taking: Reported on 05/26/2020) 20 tablet 0  . ibuprofen (ADVIL) 200 MG tablet Take 200 mg by mouth as needed for mild pain or moderate pain. (Patient not taking: Reported on 05/26/2020)     No facility-administered medications prior to visit.     ROS Review of Systems  Constitutional: Negative for activity change, appetite change and fatigue.  HENT: Negative for congestion, sinus pressure and sore throat.   Eyes: Negative for visual disturbance.  Respiratory: Negative for cough, chest tightness, shortness of breath and wheezing.   Cardiovascular: Negative for chest pain and palpitations.  Gastrointestinal: Negative for abdominal distention, abdominal pain and constipation.  Endocrine: Negative for polydipsia.  Genitourinary: Negative for dysuria and frequency.  Musculoskeletal: Negative for arthralgias and back pain.  Skin: Negative for rash.  Neurological: Negative for tremors, light-headedness and numbness.  Hematological: Does not bruise/bleed easily.  Psychiatric/Behavioral: Negative for agitation and behavioral problems.    Objective:  BP 104/66   Pulse 86   Ht 5' (1.524 m)   Wt 140 lb (63.5 kg)   SpO2 100%   BMI 27.34 kg/m   BP/Weight 08/20/2020 07/03/2020 05/26/2020    Systolic BP 104 105 106  Diastolic BP 66 70 69  Wt. (Lbs) 140 138.8 138.8  BMI 27.34 27.11 27.11      Physical Exam Constitutional:      Appearance: She is well-developed.  Neck:     Vascular: No JVD.  Cardiovascular:     Rate and Rhythm: Normal rate.     Heart sounds: Normal heart sounds. No murmur heard.   Pulmonary:     Effort: Pulmonary effort is normal.     Breath sounds: Normal breath sounds. No wheezing or rales.  Chest:     Chest wall: No tenderness.  Abdominal:     General: Bowel sounds are normal. There is no distension.     Palpations: Abdomen is soft. There is no mass.     Tenderness: There is no abdominal tenderness.  Genitourinary:    Comments: External genitalia, vagina, cervix, adnexa-normal No CMT Musculoskeletal:        General: Normal range of motion.     Right lower leg: No edema.     Left lower leg: No edema.  Neurological:     Mental Status: She is alert and oriented to person, place, and time.  Psychiatric:        Mood and Affect: Mood normal.     CMP Latest Ref Rng & Units 03/11/2020 11/26/2017 11/24/2017  Glucose 70 - 99 mg/dL 497(W) 263(Z) 81  BUN 6 - 20 mg/dL 12 10 7   Creatinine 0.44 - 1.00 mg/dL 8.58 8.50  Sodium 135 - 145 mmol/L 137 139  134(L)  Potassium 3.5 - 5.1 mmol/L 3.7 3.5 3.8  Chloride 98 - 111 mmol/L 104 105 105  CO2 22 - 32 mmol/L 23 25 19(L)  Calcium 8.9 - 10.3 mg/dL 9.1 8.9 6.0(V)  Total Protein 6.5 - 8.1 g/dL 7.9 6.7 6.8  Total Bilirubin 0.3 - 1.2 mg/dL 0.7 3.7(T) 0.5  Alkaline Phos 38 - 126 U/L 65 130(H) 163(H)  AST 15 - 41 U/L 17 21 20   ALT 0 - 44 U/L 11 9(L) 9(L)    Lipid Panel     Component Value Date/Time   CHOL 210 (H) 07/03/2020 1052   TRIG 95 07/03/2020 1052   HDL 43 07/03/2020 1052   LDLCALC 150 (H) 07/03/2020 1052    CBC    Component Value Date/Time   WBC 9.6 03/11/2020 1503   RBC 4.69 03/11/2020 1503   HGB 13.4 03/11/2020 1503   HCT 39.8 03/11/2020 1503   PLT 396 03/11/2020 1503   MCV  84.9 03/11/2020 1503   MCH 28.6 03/11/2020 1503   MCHC 33.7 03/11/2020 1503   RDW 14.3 03/11/2020 1503    Lab Results  Component Value Date   HGBA1C 5.8 05/26/2020    Assessment & Plan:  1. Screening for cervical cancer - Cytology - PAP([facility])  2. Need for influenza vaccination Flu shot administered  3. Screening for STD (sexually transmitted disease) - Cervicovaginal ancillary only   No orders of the defined types were placed in this encounter.   Follow-up: Return in about 1 year (around 08/20/2021) for complete Physical exam.       10/20/2021, MD, FAAFP. [facility] [place], [place]   08/20/2020, 9:41 AM

## 2020-08-21 LAB — CERVICOVAGINAL ANCILLARY ONLY
Bacterial Vaginitis (gardnerella): NEGATIVE
Candida Glabrata: NEGATIVE
Candida Vaginitis: NEGATIVE
Chlamydia: NEGATIVE
Comment: NEGATIVE
Comment: NEGATIVE
Comment: NEGATIVE
Comment: NEGATIVE
Comment: NEGATIVE
Comment: NORMAL
Neisseria Gonorrhea: NEGATIVE
Trichomonas: NEGATIVE

## 2020-08-22 LAB — CYTOLOGY - PAP
Comment: NEGATIVE
Diagnosis: UNDETERMINED — AB
High risk HPV: NEGATIVE

## 2020-08-28 ENCOUNTER — Telehealth: Payer: Self-pay

## 2020-08-28 NOTE — Telephone Encounter (Signed)
Patient was called and a voicemail was left informing patient to return phone call for lab results. 

## 2020-08-28 NOTE — Telephone Encounter (Signed)
-----   Message from Hoy Register, MD sent at 08/22/2020  4:41 PM EDT ----- Her labs are negative for STD. PAP smear shows atypical cells but HPV virus which causes cancer is negative. Repeat PAP smear in 1 year.

## 2020-09-02 DIAGNOSIS — Z139 Encounter for screening, unspecified: Secondary | ICD-10-CM

## 2020-09-02 LAB — GLUCOSE, POCT (MANUAL RESULT ENTRY): POC Glucose: 95 mg/dl (ref 70–99)

## 2020-09-02 NOTE — Congregational Nurse Program (Signed)
  Dept: [phone]   Congregational Nurse Program Note  Date of Encounter: 09/02/2020  Past Medical History: Past Medical History:  Diagnosis Date  . Anemia   . Sickle cell trait [facility])     Encounter Details:  CNP Questionnaire - 09/02/20 1405      Questionnaire   Patient Status Immigrant    Race African    Location Patient Served At [employer]    Uninsured Not Applicable    Food No food insecurities    Housing/Utilities Yes, have permanent housing    Transportation No transportation needs    Interpersonal Safety Yes, feel physically and emotionally safe where you currently live    Medication No medication insecurities    Medical Provider Yes    Referrals Dental;Other    ED Visit Averted Not Applicable    Life-Saving Intervention Made Not Applicable          client came to center for community health talk on wellness.She requested health screening.  BP 118/77 and pulse 81 and blood sugar 95.She also wants to get dental cleaning. CNP made call to dental program. Awaiting a call back. Orion Crook, RN, BSN, CNP [phone]

## 2020-09-03 ENCOUNTER — Telehealth: Payer: Self-pay

## 2020-09-03 NOTE — Telephone Encounter (Signed)
CNP left message that [facility] will call in 2-4 weeks to give appointment date and time. Orion Crook, RN, BSN, CNP [phone]

## 2020-09-03 NOTE — Telephone Encounter (Signed)
CNP called [facility] to help client set up a dental appointment for cleaning. The dental office will call client in 2-4 weeks to give appointment date and time. Gina Crook, RN, BSN, CNP [phone]

## 2020-09-18 ENCOUNTER — Ambulatory Visit
Admission: RE | Admit: 2020-09-18 | Discharge: 2020-09-18 | Disposition: A | Discharge status: Home / Self Care | Payer: 59 | Source: Ambulatory Visit | Attending: Family Medicine | Admitting: Family Medicine

## 2020-09-18 ENCOUNTER — Other Ambulatory Visit: Payer: Self-pay

## 2020-09-18 DIAGNOSIS — Z1231 Encounter for screening mammogram for malignant neoplasm of breast: Secondary | ICD-10-CM

## 2020-09-26 ENCOUNTER — Telehealth: Payer: Self-pay

## 2020-09-26 NOTE — Telephone Encounter (Signed)
-----   Message from Hoy Register, MD sent at 09/25/2020  6:01 PM EDT ----- Mammogram is negative for malignancy

## 2020-09-26 NOTE — Telephone Encounter (Signed)
Patient name and DOB has been verified Patient was informed of lab results. Patient had no questions.  

## 2020-10-24 ENCOUNTER — Telehealth: Payer: Self-pay | Admitting: Family Medicine

## 2020-10-24 NOTE — Telephone Encounter (Signed)
Call was placed to pt and a voicemail was left informing patient that form is ready for pick up.   Form placed up front for pick up.

## 2020-10-24 NOTE — Telephone Encounter (Signed)
PT inquiring about [employer] Forms brought on 10/22/20 for PCP to fill out. PT was notified about 7-14 day wait time and PT will be notified about when it's ready for pick up.

## 2021-02-05 IMAGING — MG DIGITAL SCREENING BILAT W/ TOMO W/ CAD
8 series · 9 of 24 positions shown · non-contrast
Comparison: None.

CLINICAL DATA: Screening.

EXAM:
DIGITAL SCREENING BILATERAL MAMMOGRAM WITH TOMO AND CAD

[R CC synth-2D]
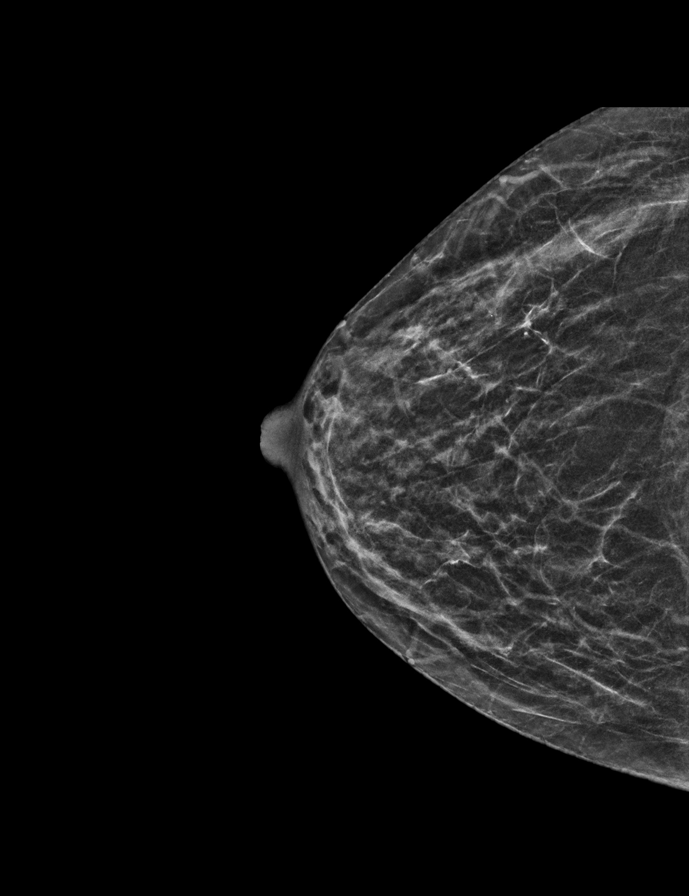

[L MLO synth-2D]
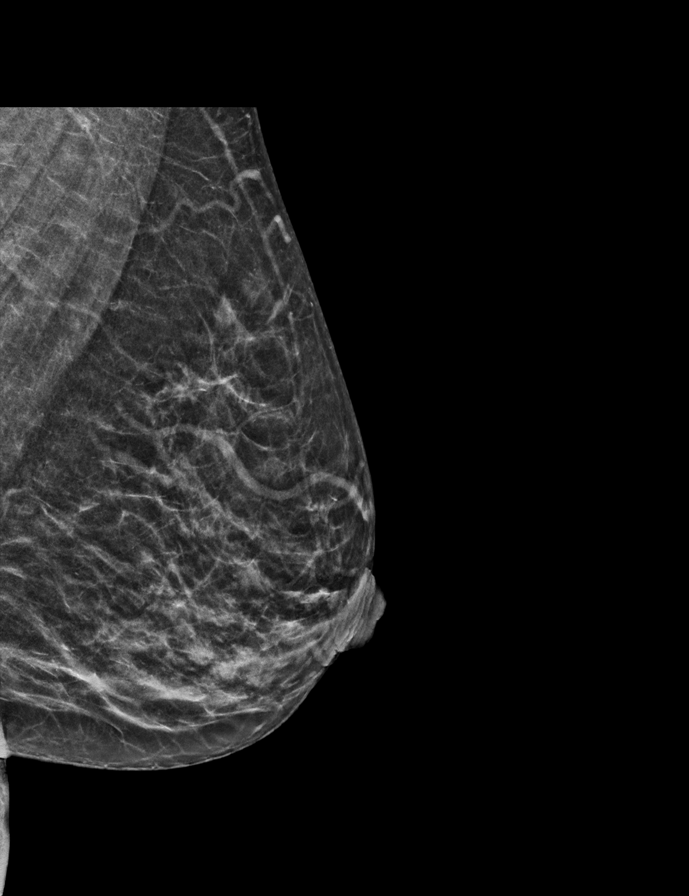

[L CC synth-2D]
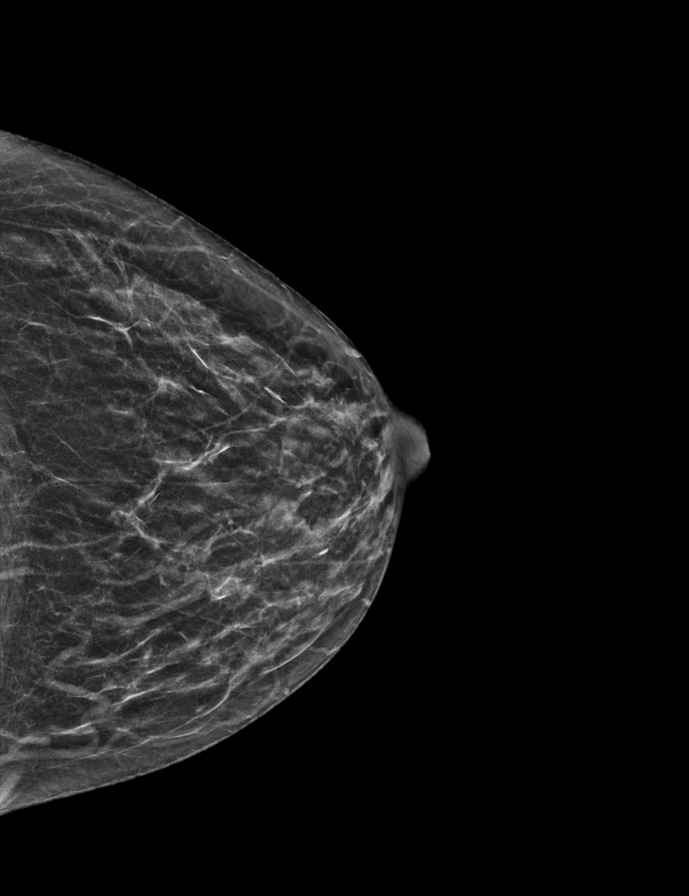

[R MLO synth-2D]
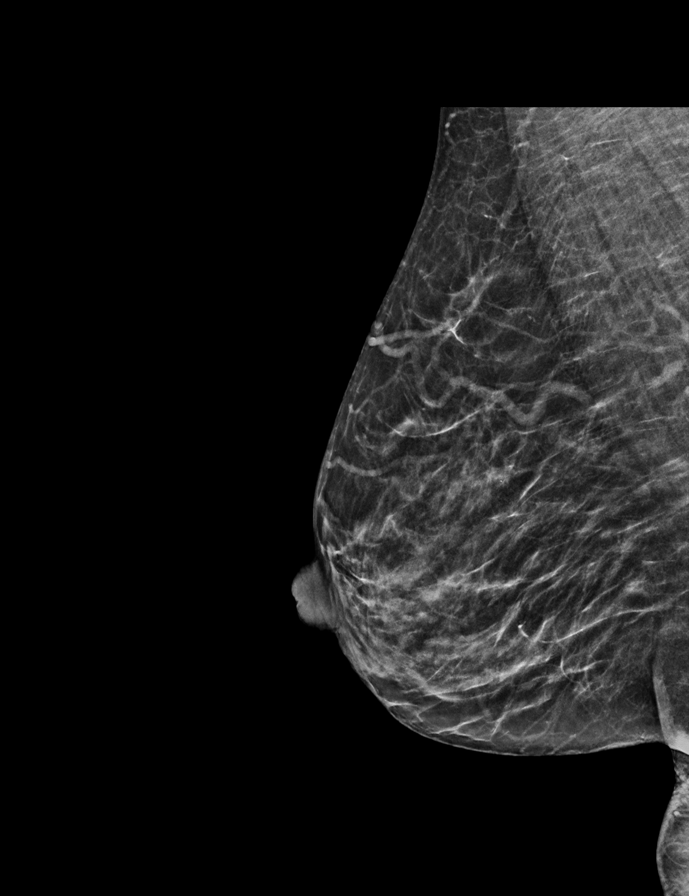

[L CC tomo · 2 of 43 frames shown]
[frame 14/43]
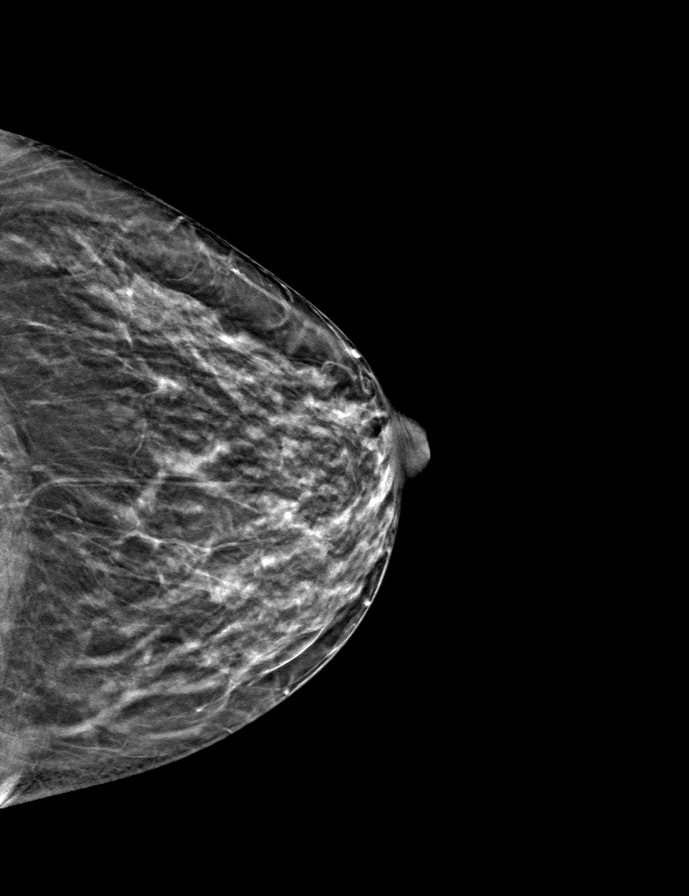
[frame 22/43]
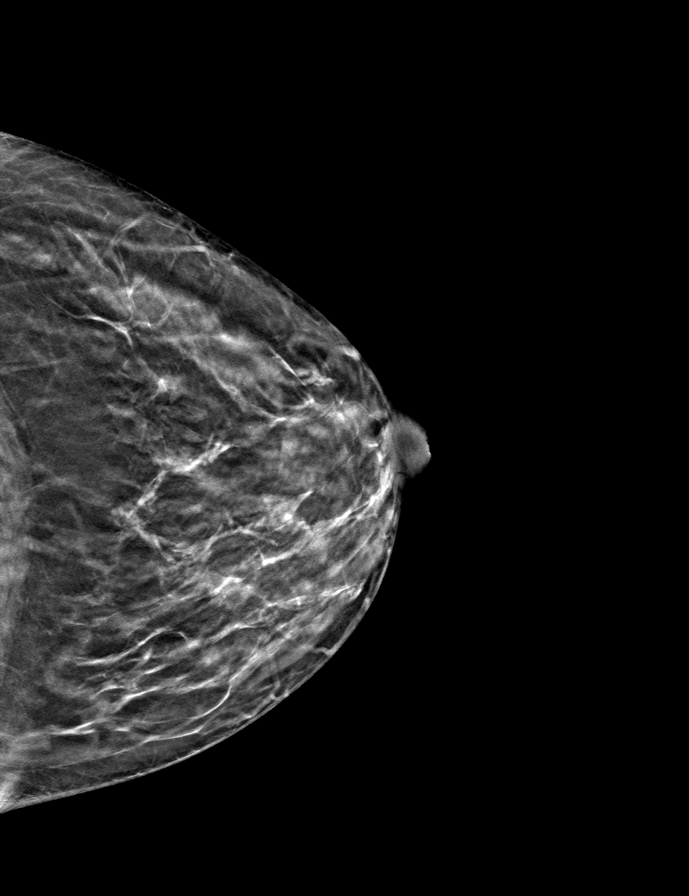

[R CC tomo · tomo slice 21/40.0]
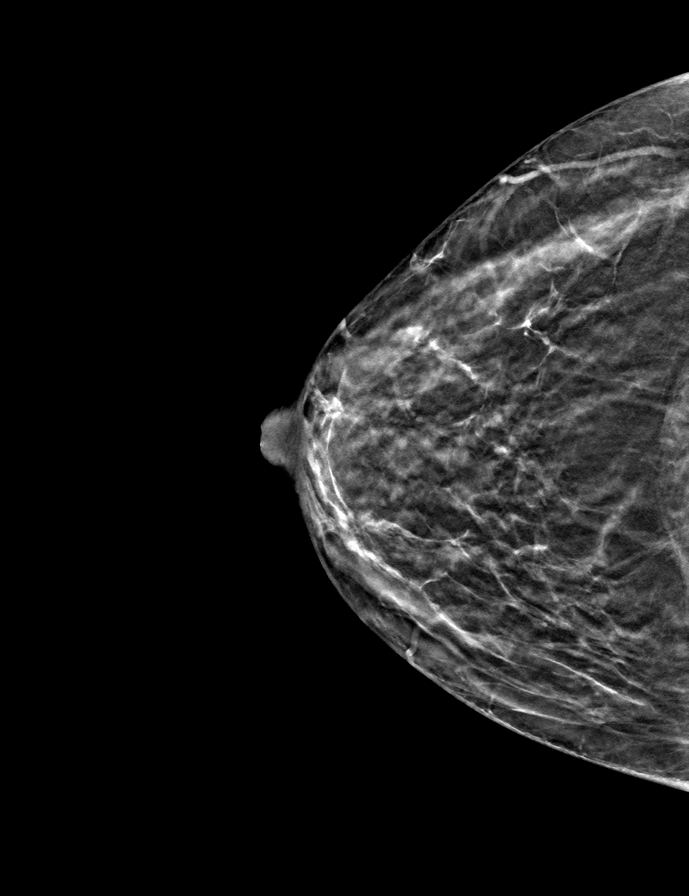

[R MLO tomo · tomo slice 25/48.0]
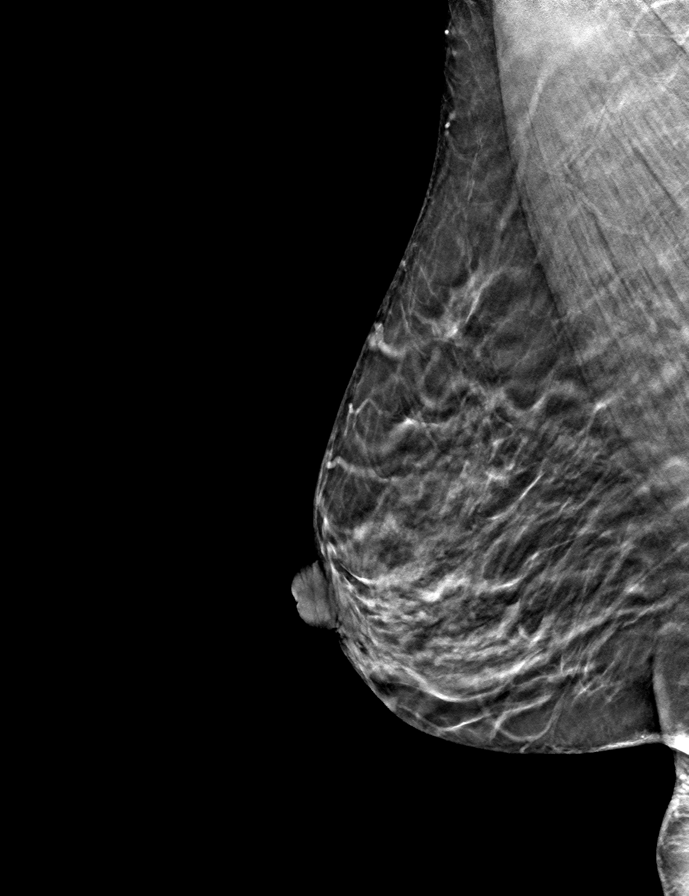

[L MLO tomo · tomo slice 23/44.0]
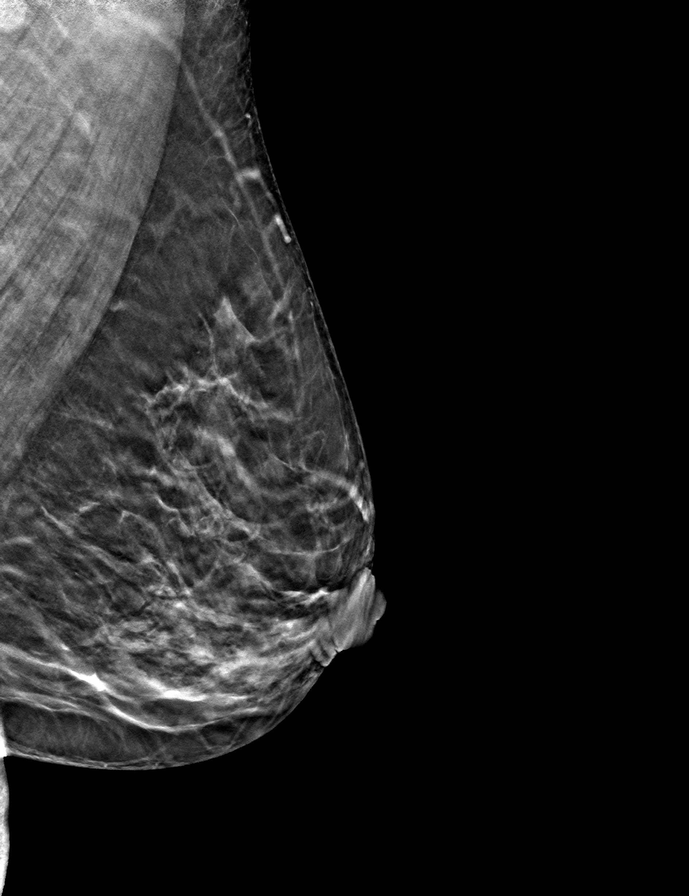

[9 of 24 positions shown; findings below may reference images not displayed]

ACR Breast Density Category c: The breast tissue is heterogeneously
dense, which may obscure small masses
FINDINGS: There are no findings suspicious for malignancy. Images were
processed with CAD.
IMPRESSION: No mammographic evidence of malignancy. A result letter of this
screening mammogram will be mailed directly to the patient.

RECOMMENDATION:
Screening mammogram in one year. (Code:EM-[id number])

BI-RADS CATEGORY  1: Negative.

## 2021-05-28 ENCOUNTER — Encounter (INDEPENDENT_AMBULATORY_CARE_PROVIDER_SITE_OTHER): Payer: Self-pay | Admitting: Primary Care

## 2021-05-28 ENCOUNTER — Ambulatory Visit (INDEPENDENT_AMBULATORY_CARE_PROVIDER_SITE_OTHER): Payer: 59 | Admitting: Primary Care

## 2021-05-28 ENCOUNTER — Other Ambulatory Visit: Payer: Self-pay

## 2021-05-28 VITALS — BP 120/84 | HR 74 | Temp 96.8°F | Resp 16 | Ht 62.0 in | Wt 129.0 lb

## 2021-05-28 DIAGNOSIS — Z23 Encounter for immunization: Secondary | ICD-10-CM | POA: Diagnosis not present

## 2021-05-28 DIAGNOSIS — Z3202 Encounter for pregnancy test, result negative: Secondary | ICD-10-CM

## 2021-05-28 DIAGNOSIS — Z0000 Encounter for general adult medical examination without abnormal findings: Secondary | ICD-10-CM | POA: Diagnosis not present

## 2021-05-28 DIAGNOSIS — N926 Irregular menstruation, unspecified: Secondary | ICD-10-CM

## 2021-05-28 DIAGNOSIS — Z1211 Encounter for screening for malignant neoplasm of colon: Secondary | ICD-10-CM

## 2021-05-28 DIAGNOSIS — Z3009 Encounter for other general counseling and advice on contraception: Secondary | ICD-10-CM

## 2021-05-28 LAB — POCT URINE PREGNANCY: Preg Test, Ur: NEGATIVE

## 2021-05-28 MED ORDER — LO LOESTRIN FE 1 MG-10 MCG / 10 MCG PO TABS: 1.0000 | ORAL_TABLET | Freq: Every day | ORAL | 11 refills | Status: DC

## 2021-05-28 NOTE — Progress Notes (Signed)
Gina Oconnor is a 50 y.o. female presents to office today for annual physical exam examination.    Concerns today include: 1. Birth control   Occupation: no, Marital status: M, Substance use: N Diet: No, Exercise: sometimes  Last eye exam: never recommended > 40 presbyopia  Last dental exam: 2000 Last colonoscopy: ordered today  Last mammogram: 09/18/20 Last pap smear: 08/20/20 Refills needed today: no Immunizations needed: Flu Vaccine: no  Tdap Vaccine: no will get on this visit  - every 8yr - (<3 lifetime doses or unknown): all wounds -- look up need for Tetanus IG - (>=3 lifetime doses): clean/minor wound if >167yrfrom previous; all other wounds if >5y33yrrom previous Zoster Vaccine: no (those >50yo, once) Pneumonia Vaccine: no (those w/ risk factors) - (<65y58yrth: Immunocompromised, cochlear implant, CSF leak, asplenic, sickle cell, Chronic Renal Failure - (<63yr1yrV-23 only: Heart dz, lung disease, DM, tobacco abuse, alcoholism, cirrhosis/liver disease. - (>63yr)24yrV13 then PPSV23 in 6-12mths;  - (>63yr):38yrat PPSV23 once if pt received prior to 50yo an18yors ha52yrassed  Past Medical History:  Diagnosis Date   Anemia    Sickle cell trait (HCC)    Reynoial History   Socioeconomic History   Marital status: Married    Spouse name: Not on file   Number of children: Not on file   Years of education: Not on file   Highest education level: Not on file  Occupational History   Not on file  Tobacco Use   Smoking status: Never   Smokeless tobacco: Never  Substance and Sexual Activity   Alcohol use: No   Drug use: No   Sexual activity: Not on file  Other Topics Concern   Not on file  Social History Narrative   Not on file   Social Determinants of Health   Financial Resource Strain: Not on file  Food Insecurity: Not on file  Transportation Needs: Not on file  Physical Activity: Not on file  Stress: Not on file  Social Connections: Not on file  Intimate  Partner Violence: Not on file   No past surgical history on file. Family History  Problem Relation Age of Onset   Breast cancer Neg Hx     Current Outpatient Medications:    cyclobenzaprine (FLEXERIL) 10 MG tablet, Take 1 tablet (10 mg total) by mouth 2 (two) times daily as needed for muscle spasms. (Patient not taking: No sig reported), Disp: 20 tablet, Rfl: 0   ibuprofen (ADVIL) 200 MG tablet, Take 200 mg by mouth as needed for mild pain or moderate pain. (Patient not taking: No sig reported), Disp: , Rfl:   No Known Allergies   ROS: Review of Systems Pertinent items noted in HPI and remainder of comprehensive ROS otherwise negative.    Physical exam BP 120/84 (BP Location: Left Arm, Patient Position: Sitting, Cuff Size: Normal)   Pulse 74   Temp (!) 96.8 F (36 C)   Resp 16   Ht _0  (1.575 m)   Wt 129 lb (58.5 kg)   LMP 05/18/2021 (Exact Date)   SpO2 100%   BMI 23.59 kg/m  General appearance: alert, cooperative, and appears stated age Head: Normocephalic, without obvious abnormality, atraumatic Eyes: conjunctivae/corneas clear. PERRL, EOM's intact. Fundi benign. Ears: normal TM's and external ear canals both ears Nose: Nares normal. Septum midline. Mucosa normal. No drainage or sinus tenderness. Neck: no adenopathy, no carotid bruit, no JVD, supple, symmetrical, trachea midline, and thyroid not enlarged, symmetric, no  tenderness/mass/nodules Back: symmetric, no curvature. ROM normal. No CVA tenderness. Lungs: clear to auscultation bilaterally Heart: regular rate and rhythm, S1, S2 normal, no murmur, click, rub or gallop Abdomen: soft, non-tender; bowel sounds normal; no masses,  no organomegaly Extremities: extremities normal, atraumatic, no cyanosis or edema Pulses: 2+ and symmetric Skin: Skin color, texture, turgor normal. No rashes or lesions    Assessment/ Plan: [name] here for annual physical exam.   Malcolm was seen today for contraception.  Diagnoses  and all orders for this visit:  Colon cancer screening Referral to GI  Annual physical exam -     CMP14+EGFR  Encounter for other general counseling or advice on contraception -     POCT urine pregnancy -     Norethindrone-Ethinyl Estradiol-Fe Biphas (LO LOESTRIN FE) 1 MG-10 MCG / 10 MCG tablet; Take 1 tablet by mouth daily.  Variable menstrual cycle -     CBC with Differential -     Norethindrone-Ethinyl Estradiol-Fe Biphas (LO LOESTRIN FE) 1 MG-10 MCG / 10 MCG tablet; Take 1 tablet by mouth daily.  Need for vaccination with combined diphtheria-tetanus-pertussis (DTaP) -     Tdap vaccine greater than or equal to 7yo IM  Counseled on healthy lifestyle choices, including diet (rich in fruits, vegetables and lean meats and low in salt and simple carbohydrates) and exercise (at least 30 minutes of moderate physical activity daily).  Patient to follow up in 1 year for annual exam or sooner if needed.  The above assessment and management plan was discussed with the patient. The patient verbalized understanding of and has agreed to the management plan. Patient is aware to call the clinic if symptoms persist or worsen. Patient is aware when to return to the clinic for a follow-up visit. Patient educated on when it is appropriate to go to the emergency department.   Juluis Mire NP-C [address] [place] [phone]

## 2021-05-28 NOTE — Patient Instructions (Signed)

## 2021-05-28 NOTE — Progress Notes (Signed)
Birth control

## 2021-05-29 LAB — CBC WITH DIFFERENTIAL/PLATELET
Basophils Absolute: 0.1 10*3/uL (ref 0.0–0.2)
Basos: 1 %
EOS (ABSOLUTE): 0.2 10*3/uL (ref 0.0–0.4)
Eos: 2 %
Hematocrit: 39.8 % (ref 34.0–46.6)
Hemoglobin: 13.3 g/dL (ref 11.1–15.9)
Immature Grans (Abs): 0 10*3/uL (ref 0.0–0.1)
Immature Granulocytes: 0 %
Lymphocytes Absolute: 3.6 10*3/uL — ABNORMAL HIGH (ref 0.7–3.1)
Lymphs: 42 %
MCH: 28.7 pg (ref 26.6–33.0)
MCHC: 33.4 g/dL (ref 31.5–35.7)
MCV: 86 fL (ref 79–97)
Monocytes Absolute: 0.4 10*3/uL (ref 0.1–0.9)
Monocytes: 5 %
Neutrophils Absolute: 4.3 10*3/uL (ref 1.4–7.0)
Neutrophils: 50 %
Platelets: 463 10*3/uL — ABNORMAL HIGH (ref 150–450)
RBC: 4.63 x10E6/uL (ref 3.77–5.28)
RDW: 14 % (ref 11.7–15.4)
WBC: 8.5 10*3/uL (ref 3.4–10.8)

## 2021-05-29 LAB — CMP14+EGFR
ALT: 11 IU/L (ref 0–32)
AST: 15 IU/L (ref 0–40)
Albumin/Globulin Ratio: 1.5 (ref 1.2–2.2)
Albumin: 4.5 g/dL (ref 3.8–4.8)
Alkaline Phosphatase: 77 IU/L (ref 44–121)
BUN/Creatinine Ratio: 22 (ref 9–23)
BUN: 16 mg/dL (ref 6–24)
Bilirubin Total: 0.3 mg/dL (ref 0.0–1.2)
CO2: 25 mmol/L (ref 20–29)
Calcium: 9.7 mg/dL (ref 8.7–10.2)
Chloride: 100 mmol/L (ref 96–106)
Creatinine, Ser: 0.72 mg/dL (ref 0.57–1.00)
Globulin, Total: 3.1 g/dL (ref 1.5–4.5)
Glucose: 92 mg/dL (ref 65–99)
Potassium: 4.1 mmol/L (ref 3.5–5.2)
Sodium: 137 mmol/L (ref 134–144)
Total Protein: 7.6 g/dL (ref 6.0–8.5)
eGFR: 103 mL/min/{1.73_m2} (ref >59)

## 2021-05-29 NOTE — Progress Notes (Signed)
Left message on voicemail per DPR. Encourage to call if they have additional questions or concerns.   

## 2022-09-13 ENCOUNTER — Ambulatory Visit: Payer: Commercial Managed Care - HMO | Attending: Family Medicine | Admitting: Family Medicine

## 2022-09-13 ENCOUNTER — Encounter: Payer: Self-pay | Admitting: Family Medicine

## 2022-09-13 ENCOUNTER — Other Ambulatory Visit (HOSPITAL_COMMUNITY)
Admission: RE | Admit: 2022-09-13 | Discharge: 2022-09-13 | Disposition: A | Discharge status: Home / Self Care | Payer: Commercial Managed Care - HMO | Source: Ambulatory Visit | Attending: Family Medicine | Admitting: Family Medicine

## 2022-09-13 VITALS — BP 122/70 | HR 86 | Temp 98.4°F | Ht 60.0 in | Wt 132.4 lb

## 2022-09-13 DIAGNOSIS — Z1211 Encounter for screening for malignant neoplasm of colon: Secondary | ICD-10-CM

## 2022-09-13 DIAGNOSIS — R202 Paresthesia of skin: Secondary | ICD-10-CM

## 2022-09-13 DIAGNOSIS — Z0001 Encounter for general adult medical examination with abnormal findings: Secondary | ICD-10-CM

## 2022-09-13 DIAGNOSIS — R87610 Atypical squamous cells of undetermined significance on cytologic smear of cervix (ASC-US): Secondary | ICD-10-CM

## 2022-09-13 DIAGNOSIS — Z0000 Encounter for general adult medical examination without abnormal findings: Secondary | ICD-10-CM

## 2022-09-13 DIAGNOSIS — Z13228 Encounter for screening for other metabolic disorders: Secondary | ICD-10-CM

## 2022-09-13 DIAGNOSIS — R7303 Prediabetes: Secondary | ICD-10-CM

## 2022-09-13 DIAGNOSIS — Z1231 Encounter for screening mammogram for malignant neoplasm of breast: Secondary | ICD-10-CM

## 2022-09-13 DIAGNOSIS — Z1159 Encounter for screening for other viral diseases: Secondary | ICD-10-CM

## 2022-09-13 LAB — POCT GLYCOSYLATED HEMOGLOBIN (HGB A1C): HbA1c, POC (prediabetic range): 5.8 % (ref 5.7–6.4)

## 2022-09-13 MED ORDER — DULOXETINE HCL 60 MG PO CPEP: 60.0000 mg | ORAL_CAPSULE | Freq: Every day | ORAL | 3 refills | Status: DC

## 2022-09-13 NOTE — Progress Notes (Signed)
Subjective:  Patient ID: Gina Oconnor, female    DOB: [birth year]  Age: 51 y.o. MRN: [id number]  CC: Pain   HPI Gina Oconnor is a 51 y.o. year old female who presents today for an annual physical exam. Last seen in the clinic 2 years ago. Due for breast and colorectal cancer screening. Last Pap smear from 08/2020 revealed presence of ASCUS, HPV negative.  Interval History: She Complains of burning which is more pronounced when the air condition is turned on for the last 1 year. This occurs in her legs and lower back and sometimes lasts the whole day. She uses Tylenol, Ibuprofen with some relief. She has also had pain in her legs and lower back and she states the pain and burning occur together.  Past Medical History:  Diagnosis Date   Anemia    Sickle cell trait ([place])     No past surgical history on file.  Family History  Problem Relation Age of Onset   Breast cancer Neg Hx     Social History   Socioeconomic History   Marital status: Married    Spouse name: Not on file   Number of children: Not on file   Years of education: Not on file   Highest education level: Not on file  Occupational History   Not on file  Tobacco Use   Smoking status: Never   Smokeless tobacco: Never  Substance and Sexual Activity   Alcohol use: No   Drug use: No   Sexual activity: Not on file  Other Topics Concern   Not on file  Social History Narrative   Not on file   Social Determinants of Health   Financial Resource Strain: Not on file  Food Insecurity: Not on file  Transportation Needs: Not on file  Physical Activity: Not on file  Stress: Not on file  Social Connections: Not on file    No Known Allergies  Outpatient Medications Prior to Visit  Medication Sig Dispense Refill   cyclobenzaprine (FLEXERIL) 10 MG tablet Take 1 tablet (10 mg total) by mouth 2 (two) times daily as needed for muscle spasms. (Patient not taking: Reported on 05/26/2020) 20 tablet 0   ibuprofen (ADVIL)  200 MG tablet Take 200 mg by mouth as needed for mild pain or moderate pain. (Patient not taking: Reported on 05/26/2020)     Norethindrone-Ethinyl Estradiol-Fe Biphas (LO LOESTRIN FE) 1 MG-10 MCG / 10 MCG tablet Take 1 tablet by mouth daily. (Patient not taking: Reported on 09/13/2022) 30 tablet 11   No facility-administered medications prior to visit.     ROS Review of Systems  Constitutional:  Negative for activity change and appetite change.  HENT:  Negative for sinus pressure and sore throat.   Respiratory:  Negative for chest tightness, shortness of breath and wheezing.   Cardiovascular:  Negative for chest pain and palpitations.  Gastrointestinal:  Negative for abdominal distention, abdominal pain and constipation.  Genitourinary: Negative.   Musculoskeletal:  Positive for back pain.  Psychiatric/Behavioral:  Negative for behavioral problems and dysphoric mood.     Objective:  BP 122/70   Pulse 86   Temp 98.4 F (36.9 C) (Oral)   Ht 5' (1.524 m)   Wt 132 lb 6.4 oz (60.1 kg)   SpO2 96%   BMI 25.86 kg/m      09/13/2022   11:24 AM 05/28/2021   10:31 AM 09/02/2020    2:05 PM  BP/Weight  Systolic BP 117 356 701  Diastolic  BP 70 84 77  Wt. (Lbs) 132.4 129   BMI 25.86 kg/m2 23.59 kg/m2       Physical Exam Exam conducted with a chaperone present.  Constitutional:      General: She is not in acute distress.    Appearance: She is well-developed. She is not diaphoretic.  HENT:     Head: Normocephalic.     Right Ear: External ear normal.     Left Ear: External ear normal.     Nose: Nose normal.  Eyes:     Conjunctiva/sclera: Conjunctivae normal.     Pupils: Pupils are equal, round, and reactive to light.  Neck:     Vascular: No JVD.  Cardiovascular:     Rate and Rhythm: Normal rate and regular rhythm.     Heart sounds: Normal heart sounds. No murmur heard.    No gallop.  Pulmonary:     Effort: Pulmonary effort is normal. No respiratory distress.     Breath  sounds: Normal breath sounds. No wheezing or rales.  Chest:     Chest wall: No tenderness.  Breasts:    Right: Normal. No mass, nipple discharge or tenderness.     Left: Normal. No mass, nipple discharge or tenderness.  Abdominal:     General: Bowel sounds are normal. There is no distension.     Palpations: Abdomen is soft. There is no mass.     Tenderness: There is no abdominal tenderness.     Hernia: There is no hernia in the left inguinal area or right inguinal area.  Genitourinary:    General: Normal vulva.     Pubic Area: No rash.      Labia:        Right: No rash.        Left: No rash.      Vagina: Normal.     Cervix: Normal.     Uterus: Normal.      Adnexa: Right adnexa normal and left adnexa normal.       Right: No tenderness.         Left: No tenderness.    Musculoskeletal:        General: No tenderness. Normal range of motion.     Cervical back: Normal range of motion. No tenderness.  Lymphadenopathy:     Upper Body:     Right upper body: No supraclavicular or axillary adenopathy.     Left upper body: No supraclavicular or axillary adenopathy.  Skin:    General: Skin is warm and dry.  Neurological:     Mental Status: She is alert and oriented to person, place, and time.     Deep Tendon Reflexes: Reflexes are normal and symmetric.        Latest Ref Rng & Units 05/28/2021   11:25 AM 03/11/2020    3:03 PM 11/26/2017    2:12 PM  CMP  Glucose 65 - 99 mg/dL 92  125  112   BUN 6 - 24 mg/dL _0 Creatinine 0.57 - 1.00 mg/dL 0.72  0.70  0.75   Sodium 134 - 144 mmol/L 137  137  139   Potassium 3.5 - 5.2 mmol/L 4.1  3.7  3.5   Chloride 96 - 106 mmol/L 100  104  105   CO2 20 - 29 mmol/L _1 Calcium 8.7 - 10.2 mg/dL 9.7  9.1  8.9   Total Protein 6.0 - 8.5 g/dL 7.6  7.9  6.7   Total Bilirubin 0.0 - 1.2 mg/dL 0.3  0.7  0.2   Alkaline Phos 44 - 121 IU/L 77  65  130   AST 0 - 40 IU/L _0 ALT 0 - 32 IU/L _1 Lipid Panel      Component Value Date/Time   CHOL 210 (H) 07/03/2020 1052   TRIG 95 07/03/2020 1052   HDL 43 07/03/2020 1052   LDLCALC 150 (H) 07/03/2020 1052    CBC    Component Value Date/Time   WBC 8.5 05/28/2021 1125   WBC 9.6 03/11/2020 1503   RBC 4.63 05/28/2021 1125   RBC 4.69 03/11/2020 1503   HGB 13.3 05/28/2021 1125   HCT 39.8 05/28/2021 1125   PLT 463 (H) 05/28/2021 1125   MCV 86 05/28/2021 1125   MCH 28.7 05/28/2021 1125   MCH 28.6 03/11/2020 1503   MCHC 33.4 05/28/2021 1125   MCHC 33.7 03/11/2020 1503   RDW 14.0 05/28/2021 1125   LYMPHSABS 3.6 (H) 05/28/2021 1125   EOSABS 0.2 05/28/2021 1125   BASOSABS 0.1 05/28/2021 1125    Lab Results  Component Value Date   HGBA1C 5.8 09/13/2022    Assessment & Plan:  1. Annual physical exam Counseled on 150 minutes of exercise per week, healthy eating (including decreased daily intake of saturated fats, cholesterol, added sugars, sodium), routine healthcare maintenance.   2. Prediabetes Labs reveal prediabetes with an A1c of 5.8.  Working on a low carbohydrate diet, exercise, weight loss is recommended in order to prevent progression to type 2 diabetes mellitus.  - POCT glycosylated hemoglobin (Hb A1C)  3. Encounter for screening mammogram for malignant neoplasm of breast - MM DIGITAL SCREENING BILATERAL; Future  4. Screening for colon cancer - Fecal occult blood, imunochemical  5. ASCUS of cervix with negative high risk HPV From last Pap smear on 08/2020, she was post return 2022 but never did - Cytology - PAP  6. Screening for metabolic disorder - LP+Non-HDL Cholesterol; Future - CMP14+EGFR; Future  7. Screening for viral disease - HCV Ab w Reflex to Quant PCR; Future  8. Paresthesia Unknown etiology A1c is negative for diabetes mellitus We will initiate Cymbalta and work-up by means of labs - CBC with Differential/Platelet; Future - Vitamin B12; Future - DULoxetine (CYMBALTA) 60 MG capsule; Take 1 capsule (60 mg  total) by mouth daily. For burning and pain  Dispense: 30 capsule; Refill: 3    Meds ordered this encounter  Medications   DULoxetine (CYMBALTA) 60 MG capsule    Sig: Take 1 capsule (60 mg total) by mouth daily. For burning and pain    Dispense:  30 capsule    Refill:  3    Follow-up: Return in about 6 months (around 03/15/2023), or if symptoms worsen or fail to improve, for Medical conditions with PCP.       Charlott Rakes, MD, FAAFP. [facility] [place], [place]   09/13/2022, 12:13 PM

## 2022-09-13 NOTE — Progress Notes (Signed)
Has pain all over body Also has burning sensation under skin.

## 2022-09-14 ENCOUNTER — Ambulatory Visit: Payer: Commercial Managed Care - HMO | Attending: Family Medicine

## 2022-09-14 DIAGNOSIS — Z1159 Encounter for screening for other viral diseases: Secondary | ICD-10-CM

## 2022-09-14 DIAGNOSIS — Z13228 Encounter for screening for other metabolic disorders: Secondary | ICD-10-CM

## 2022-09-14 DIAGNOSIS — R202 Paresthesia of skin: Secondary | ICD-10-CM

## 2022-09-15 LAB — CBC WITH DIFFERENTIAL/PLATELET
Basophils Absolute: 0 10*3/uL (ref 0.0–0.2)
Basos: 1 %
EOS (ABSOLUTE): 0.1 10*3/uL (ref 0.0–0.4)
Eos: 1 %
Hematocrit: 37.2 % (ref 34.0–46.6)
Hemoglobin: 12.8 g/dL (ref 11.1–15.9)
Immature Grans (Abs): 0 10*3/uL (ref 0.0–0.1)
Immature Granulocytes: 0 %
Lymphocytes Absolute: 3.2 10*3/uL — ABNORMAL HIGH (ref 0.7–3.1)
Lymphs: 46 %
MCH: 29.3 pg (ref 26.6–33.0)
MCHC: 34.4 g/dL (ref 31.5–35.7)
MCV: 85 fL (ref 79–97)
Monocytes Absolute: 0.4 10*3/uL (ref 0.1–0.9)
Monocytes: 6 %
Neutrophils Absolute: 3.1 10*3/uL (ref 1.4–7.0)
Neutrophils: 46 %
Platelets: 373 10*3/uL (ref 150–450)
RBC: 4.37 x10E6/uL (ref 3.77–5.28)
RDW: 13.7 % (ref 11.7–15.4)
WBC: 6.7 10*3/uL (ref 3.4–10.8)

## 2022-09-15 LAB — CMP14+EGFR
ALT: 9 IU/L (ref 0–32)
AST: 15 IU/L (ref 0–40)
Albumin/Globulin Ratio: 1.7 (ref 1.2–2.2)
Albumin: 4.5 g/dL (ref 3.9–4.9)
Alkaline Phosphatase: 79 IU/L (ref 44–121)
BUN/Creatinine Ratio: 9 (ref 9–23)
BUN: 7 mg/dL (ref 6–24)
Bilirubin Total: 0.5 mg/dL (ref 0.0–1.2)
CO2: 21 mmol/L (ref 20–29)
Calcium: 9.4 mg/dL (ref 8.7–10.2)
Chloride: 105 mmol/L (ref 96–106)
Creatinine, Ser: 0.76 mg/dL (ref 0.57–1.00)
Globulin, Total: 2.7 g/dL (ref 1.5–4.5)
Glucose: 94 mg/dL (ref 70–99)
Potassium: 4.3 mmol/L (ref 3.5–5.2)
Sodium: 141 mmol/L (ref 134–144)
Total Protein: 7.2 g/dL (ref 6.0–8.5)
eGFR: 96 mL/min/{1.73_m2} (ref >59)

## 2022-09-15 LAB — LP+NON-HDL CHOLESTEROL
Cholesterol, Total: 155 mg/dL (ref 100–199)
HDL: 46 mg/dL (ref >39)
LDL Chol Calc (NIH): 96 mg/dL (ref 0–99)
Total Non-HDL-Chol (LDL+VLDL): 109 mg/dL (ref 0–129)
Triglycerides: 64 mg/dL (ref 0–149)
VLDL Cholesterol Cal: 13 mg/dL (ref 5–40)

## 2022-09-15 LAB — HCV INTERPRETATION

## 2022-09-15 LAB — HCV AB W REFLEX TO QUANT PCR: HCV Ab: NONREACTIVE

## 2022-09-15 LAB — VITAMIN B12: Vitamin B-12: 495 pg/mL (ref 232–1245)

## 2022-09-16 LAB — CYTOLOGY - PAP
Comment: NEGATIVE
Diagnosis: NEGATIVE
High risk HPV: NEGATIVE

## 2022-12-09 ENCOUNTER — Ambulatory Visit
Admission: RE | Admit: 2022-12-09 | Discharge: 2022-12-09 | Disposition: A | Discharge status: Home / Self Care | Payer: Commercial Managed Care - HMO | Source: Ambulatory Visit | Attending: Family Medicine | Admitting: Family Medicine

## 2022-12-09 DIAGNOSIS — Z1231 Encounter for screening mammogram for malignant neoplasm of breast: Secondary | ICD-10-CM

## 2023-02-02 ENCOUNTER — Other Ambulatory Visit: Payer: Self-pay | Admitting: Family Medicine

## 2023-02-02 DIAGNOSIS — R202 Paresthesia of skin: Secondary | ICD-10-CM

## 2023-02-02 NOTE — Telephone Encounter (Signed)
Requested Prescriptions  Pending Prescriptions Disp Refills   DULoxetine (CYMBALTA) 60 MG capsule [Pharmacy Med Name: DULOXETINE DR 60MG CAPSULES] 90 capsule 0    Sig: TAKE 1 CAPSULE(60 MG) BY MOUTH DAILY FOR BURNING OR PAIN     Psychiatry: Antidepressants - SNRI - duloxetine Passed - 02/02/2023  4:08 PM      Passed - Cr in normal range and within 360 days    Creatinine, Ser  Date Value Ref Range Status  09/14/2022 0.76 0.57 - 1.00 mg/dL Final   Creatinine, Urine  Date Value Ref Range Status  11/26/2017 25.00 mg/dL Final         Passed - eGFR is 30 or above and within 360 days    GFR calc Af Amer  Date Value Ref Range Status  03/11/2020 >60 >60 mL/min Final   GFR calc non Af Amer  Date Value Ref Range Status  03/11/2020 >60 >60 mL/min Final   eGFR  Date Value Ref Range Status  09/14/2022 96 >59 mL/min/1.73 Final         Passed - Completed PHQ-2 or PHQ-9 in the last 360 days      Passed - Last BP in normal range    BP Readings from Last 1 Encounters:  09/13/22 122/70         Passed - Valid encounter within last 6 months    Recent Outpatient Visits           4 months ago Annual physical exam   [place] Charlott Rakes, MD   1 year ago Colon cancer screening   [place] [facility] Kerin Perna, NP   2 years ago Screening for cervical cancer   [place] Charlott Rakes, MD   2 years ago Annual physical exam   [place], MD   2 years ago Screening for diabetes mellitus   [place], [place], MD       Future Appointments             In 1 month Charlott Rakes, MD [place]

## 2023-02-09 ENCOUNTER — Ambulatory Visit: Payer: BLUE CROSS/BLUE SHIELD | Attending: Physician Assistant | Admitting: Physician Assistant

## 2023-02-09 ENCOUNTER — Encounter: Payer: Self-pay | Admitting: Physician Assistant

## 2023-02-09 ENCOUNTER — Other Ambulatory Visit: Payer: Self-pay

## 2023-02-09 VITALS — BP 134/82 | HR 63 | Ht 63.0 in | Wt 126.0 lb

## 2023-02-09 DIAGNOSIS — M791 Myalgia, unspecified site: Secondary | ICD-10-CM | POA: Diagnosis not present

## 2023-02-09 DIAGNOSIS — Z789 Other specified health status: Secondary | ICD-10-CM

## 2023-02-09 DIAGNOSIS — R7303 Prediabetes: Secondary | ICD-10-CM

## 2023-02-09 DIAGNOSIS — R42 Dizziness and giddiness: Secondary | ICD-10-CM | POA: Diagnosis not present

## 2023-02-09 DIAGNOSIS — H9190 Unspecified hearing loss, unspecified ear: Secondary | ICD-10-CM

## 2023-02-09 LAB — GLUCOSE, POCT (MANUAL RESULT ENTRY): POC Glucose: 100 mg/dl — AB (ref 70–99)

## 2023-02-09 MED ORDER — FLUTICASONE PROPIONATE 50 MCG/ACT NA SUSP
2.0000 | Freq: Every day | NASAL | 6 refills | Status: AC
  Filled 2023-02-09: qty 16, 30d supply, fill #0

## 2023-02-09 MED ORDER — DICLOFENAC SODIUM 75 MG PO TBEC
75.0000 mg | DELAYED_RELEASE_TABLET | Freq: Two times a day (BID) | ORAL | 1 refills | Status: AC
  Filled 2023-02-09: qty 60, 30d supply, fill #0

## 2023-02-09 NOTE — Progress Notes (Signed)
Patient ID: Gina Oconnor, female   DOB: 06/05/1971, 52 y.o.   MRN: [id number]   Gina Oconnor, is a 52 y.o. female  [id number]  [id number]  DOB - 1971/03/11  Chief Complaint  Patient presents with   Dizziness       Subjective:   Gina Oconnor is a 52 y.o. female here today for dizziness on and off for months.  She notices it usu if she bends or stoops then gets up too quickly.  She does not drink a lot of water.  Had CPE 09/2022 and labs essentially normal.  No vision changes.  No CP/SOB.  Wants blood sugar checked  Also c/o "body feels heavy." She c/o fatigue.  Flexeril makes her sleepy so she is not taking it.  She also c/o body aches.    Decreased hearing and says she has trouble hearing low tones esp.  She would like to have her hearing checked.    No problems updated.  ALLERGIES: No Known Allergies  PAST MEDICAL HISTORY: Past Medical History:  Diagnosis Date   Anemia    Sickle cell trait ([place])     MEDICATIONS AT HOME: Prior to Admission medications   Medication Sig Start Date End Date Taking? Authorizing Provider  diclofenac (VOLTAREN) 75 MG EC tablet Take 1 tablet (75 mg total) by mouth 2 (two) times daily. 02/09/23  Yes Severus Brodzinski M, PA-C  fluticasone (FLONASE) 50 MCG/ACT nasal spray Place 2 sprays into both nostrils daily. 02/09/23  Yes Deven Furia, Dionne Bucy, PA-C    ROS: Neg resp Neg cardiac Neg GI Neg GU Neg MS Neg psych   Objective:   Vitals:   02/09/23 1001  BP: 134/82  Pulse: 63  SpO2: 98%  Weight: 126 lb (57.2 kg)  Height: '5\' 3"'$  (1.6 m)   Exam General appearance : Awake, alert, not in any distress. Speech Clear. Not toxic looking HEENT: Atraumatic and Normocephalic,  small amount of cerumen in each canal but no impaction.  TM appear normal B Neck: Supple, no JVD. No cervical lymphadenopathy.  Chest: Good air entry bilaterally, CTAB.  No rales/rhonchi/wheezing CVS: S1 S2 regular, no murmurs.  Extremities: B/L Lower Ext shows no  edema, both legs are warm to touch Neurology: Awake alert, and oriented X 3, CN II-XII intact, Non focal Skin: No Rash  Data Review Lab Results  Component Value Date   HGBA1C 5.8 09/13/2022   HGBA1C 5.8 05/26/2020    Assessment & Plan   1. Dizziness Poor water intake.  No red flags.  Some congestion-will add flonase - TSH - fluticasone (FLONASE) 50 MCG/ACT nasal spray; Place 2 sprays into both nostrils daily.  Dispense: 16 g; Refill: 6  2. Prediabetes   I have advised the patient to work at a goal of eliminating sugary drinks, candy, desserts, sweets, refined sugars, processed foods, and white carbohydrates.  The patient expresses understanding.   - Glucose (CBG)  3. Muscle pain D/c flexeril - diclofenac (VOLTAREN) 75 MG EC tablet; Take 1 tablet (75 mg total) by mouth 2 (two) times daily.  Dispense: 60 tablet; Refill: 1  4. Language barrier AMN interpreters used and additional time performing visit was required. "Ali"  5. Decreased hearing, unspecified laterality - Ambulatory referral to ENT    Return in about 6 months (around 08/10/2023) for Dr Margarita Rana for chronic conditions.  The patient was given clear instructions to go to ER or return to medical center if symptoms don't improve, worsen or new problems develop. The  patient verbalized understanding. The patient was told to call to get lab results if they haven't heard anything in the next week.      Freeman Caldron, PA-C [facility] [place], [place]   02/09/2023, 10:22 AM

## 2023-02-09 NOTE — Patient Instructions (Addendum)
Drink 80-100 ounces water daily   Dizziness Dizziness is a common problem. It makes you feel unsteady or light-headed. You may feel like you are about to pass out (faint). Dizziness can lead to getting hurt if you stumble or fall. Dizziness can be caused by many things, including: Medicines. Not having enough water in your body (dehydration). Illness. Follow these instructions at home: Eating and drinking  Drink enough fluid to keep your pee (urine) pale yellow. This helps to keep you from getting dehydrated. Try to drink more clear fluids, such as water. Do not drink alcohol. Limit how much caffeine you drink or eat, if your doctor tells you to do that. Limit how much salt (sodium) you drink or eat, if your doctor tells you to do that. Activity  Avoid making quick movements. Stand up slowly from sitting in a chair, and steady yourself until you feel okay. In the morning, first sit up on the side of the bed. When you feel okay, stand up slowly while you hold onto something. Do this until you know that your balance is okay. If you need to stand in one place for a long time, move your legs often. Tighten and relax the muscles in your legs while you are standing. Do not drive or use machinery if you feel dizzy. Avoid bending down if you feel dizzy. Place items in your home so you can reach them easily without leaning over. Lifestyle Do not smoke or use any products that contain nicotine or tobacco. If you need help quitting, ask your doctor. Try to lower your stress level. You can do this by using methods such as yoga or meditation. Talk with your doctor if you need help. General instructions Watch your dizziness for any changes. Take over-the-counter and prescription medicines only as told by your doctor. Talk with your doctor if you think that you are dizzy because of a medicine that you are taking. Tell a friend or a family member that you are feeling dizzy. If he or she notices any  changes in your behavior, have this person call your doctor. Keep all follow-up visits. Contact a doctor if: Your dizziness does not go away. Your dizziness or light-headedness gets worse. You feel like you may vomit (are nauseous). You have trouble hearing. You have new symptoms. You are unsteady on your feet. You feel like the room is spinning. You have neck pain or a stiff neck. You have a fever. Get help right away if: You vomit or have watery poop (diarrhea), and you cannot eat or drink anything. You have trouble: Talking. Walking. Swallowing. Using your arms, hands, or legs. You feel generally weak. You are not thinking clearly, or you have trouble forming sentences. A friend or family member may notice this. You have: Chest pain. Pain in your belly (abdomen). Shortness of breath. Sweating. Your vision changes. You are bleeding. You have a very bad headache. These symptoms may be an emergency. Get help right away. Call your local emergency services (911 in the [place] Do not wait to see if the symptoms will go away. Do not drive yourself to the hospital. Summary Dizziness makes you feel unsteady or light-headed. You may feel like you are about to pass out (faint). Drink enough fluid to keep your pee (urine) pale yellow. Do not drink alcohol. Avoid making quick movements if you feel dizzy. Watch your dizziness for any changes. This information is not intended to replace advice given to you by your health care  provider. Make sure you discuss any questions you have with your health care provider. Document Revised: 11/03/2020 Document Reviewed: 11/03/2020 Elsevier Patient Education  [place].

## 2023-02-10 LAB — TSH: TSH: 1.34 u[IU]/mL (ref 0.450–4.500)

## 2023-03-15 ENCOUNTER — Ambulatory Visit: Payer: Self-pay | Admitting: Family Medicine

## 2023-04-19 ENCOUNTER — Other Ambulatory Visit: Payer: Self-pay

## 2023-05-02 ENCOUNTER — Telehealth: Payer: Self-pay

## 2023-05-02 NOTE — Telephone Encounter (Signed)
   Telephone encounter was:  Unsuccessful.  05/02/2023 Name: Gina Oconnor MRN: [id number] DOB: October 03, 1971  Unsuccessful outbound call made today to assist with:   Colorectal Cancer Screening  Outreach Attempt:  1st Attempt  A HIPAA compliant voice message was left requesting a return call.  Instructed patient to call back.    Lenard Forth [facility], [facility] [phone] [address] [place], [place], [place] [address] Phone: [phone] Email: [email or web address]

## 2023-05-03 ENCOUNTER — Telehealth: Payer: Self-pay

## 2023-05-03 NOTE — Telephone Encounter (Signed)
   Telephone encounter was:  Unsuccessful.  05/03/2023 Name: Gina Oconnor MRN: [id number] DOB: 29-Mar-1971  Unsuccessful outbound call made today to assist with:   Colorectal Cancer Screening  Outreach Attempt:  2nd Attempt  A HIPAA compliant voice message was left requesting a return call.  Instructed patient to call back .    Lenard Forth [facility] Guide, [facility] [phone] [address] [place], [place], [place] [address] Phone: [phone] Email: [email or web address]

## 2023-05-06 ENCOUNTER — Telehealth: Payer: Self-pay

## 2023-05-06 NOTE — Telephone Encounter (Signed)
   Telephone encounter was:  Unsuccessful.  05/06/2023 Name: Gina Oconnor MRN: [id number] DOB: Jun 21, 1971  Unsuccessful outbound call made today to assist with:   Colorectal Cancer Screening  Outreach Attempt:  3rd Attempt.  Referral closed unable to contact patient.  A HIPAA compliant voice message was left requesting a return call.  Instructed patient to call back.    Lenard Forth [facility] Guide, [facility] [phone] [address] [place], [place], [place] [address] Phone: [phone] Email: [email or web address]

## 2023-08-10 ENCOUNTER — Ambulatory Visit: Payer: BLUE CROSS/BLUE SHIELD | Admitting: Family Medicine

## 2023-09-01 ENCOUNTER — Ambulatory Visit: Payer: BLUE CROSS/BLUE SHIELD | Attending: Family Medicine | Admitting: Family Medicine

## 2023-09-01 ENCOUNTER — Other Ambulatory Visit: Payer: Self-pay

## 2023-09-01 ENCOUNTER — Encounter: Payer: Self-pay | Admitting: Family Medicine

## 2023-09-01 VITALS — BP 114/76 | HR 73 | Ht 63.0 in | Wt 129.4 lb

## 2023-09-01 DIAGNOSIS — R202 Paresthesia of skin: Secondary | ICD-10-CM

## 2023-09-01 DIAGNOSIS — R7303 Prediabetes: Secondary | ICD-10-CM | POA: Diagnosis not present

## 2023-09-01 DIAGNOSIS — Z13228 Encounter for screening for other metabolic disorders: Secondary | ICD-10-CM

## 2023-09-01 LAB — POCT GLYCOSYLATED HEMOGLOBIN (HGB A1C): HbA1c, POC (prediabetic range): 6.1 % (ref 5.7–6.4)

## 2023-09-01 MED ORDER — GABAPENTIN 300 MG PO CAPS
300.0000 mg | ORAL_CAPSULE | Freq: Every day | ORAL | 3 refills | Status: AC
  Filled 2023-09-01: qty 30, 30d supply, fill #0

## 2023-09-01 NOTE — Progress Notes (Signed)
Subjective:  Patient ID: Bo Merino, female    DOB: [birth year]  Age: 52 y.o. MRN: [id number]  CC: Medical Management of Chronic Issues (Numbness in both arms)   HPI Naleah Wilhemena Durie is a 52 y.o. year old female with a history of paresthesia in her hands.  Interval History: Discussed the use of AI scribe software for clinical note transcription with the patient, who gave verbal consent to proceed.   The patient had presented with complaints of numbness last year and today presents with a sensation of blood pooling and not circulating properly in her hand. The sensation is described as a heaviness and warmth, and is sometimes relieved by raising the hand. The patient also reports occasional pain in the hand. The patient denies any numbness in the hand at the time of the visit but states this is intermittent.  The patient also mentions having to change her hand's position at night for relief, suggesting the symptoms may be affecting her sleep.        Past Medical History:  Diagnosis Date   Anemia    Sickle cell trait (HCC)     No past surgical history on file.  Family History  Problem Relation Age of Onset   Breast cancer Neg Hx     Social History   Socioeconomic History   Marital status: Married    Spouse name: Not on file   Number of children: Not on file   Years of education: Not on file   Highest education level: Not on file  Occupational History   Not on file  Tobacco Use   Smoking status: Never   Smokeless tobacco: Never  Substance and Sexual Activity   Alcohol use: No   Drug use: No   Sexual activity: Not on file  Other Topics Concern   Not on file  Social History Narrative   Not on file   Social Determinants of Health   Financial Resource Strain: Not on file  Food Insecurity: Not on file  Transportation Needs: Not on file  Physical Activity: Not on file  Stress: Not on file  Social Connections: Not on file    No Known  Allergies  Outpatient Medications Prior to Visit  Medication Sig Dispense Refill   diclofenac (VOLTAREN) 75 MG EC tablet Take 1 tablet (75 mg total) by mouth 2 (two) times daily. (Patient not taking: Reported on 09/01/2023) 60 tablet 1   fluticasone (FLONASE) 50 MCG/ACT nasal spray Place 2 sprays into both nostrils daily. (Patient not taking: Reported on 09/01/2023) 16 g 6   No facility-administered medications prior to visit.     ROS Review of Systems  Constitutional:  Negative for activity change and appetite change.  HENT:  Negative for sinus pressure and sore throat.   Respiratory:  Negative for chest tightness, shortness of breath and wheezing.   Cardiovascular:  Negative for chest pain and palpitations.  Gastrointestinal:  Negative for abdominal distention, abdominal pain and constipation.  Genitourinary: Negative.   Musculoskeletal: Negative.   Neurological:  Positive for numbness.  Psychiatric/Behavioral:  Negative for behavioral problems and dysphoric mood.     Objective:  BP 114/76   Pulse 73   Ht 5\' 3"  (1.6 m)   Wt 129 lb 6.4 oz (58.7 kg)   SpO2 99%   BMI 22.92 kg/m      09/01/2023   10:10 AM 02/09/2023   10:01 AM 09/13/2022   11:24 AM  BP/Weight  Systolic BP 114  134 122  Diastolic BP 76 82 70  Wt. (Lbs) 129.4 126 132.4  BMI 22.92 kg/m2 22.32 kg/m2 25.86 kg/m2      Physical Exam Constitutional:      Appearance: She is well-developed.  Cardiovascular:     Rate and Rhythm: Normal rate.     Heart sounds: Normal heart sounds. No murmur heard. Pulmonary:     Effort: Pulmonary effort is normal.     Breath sounds: Normal breath sounds. No wheezing or rales.  Chest:     Chest wall: No tenderness.  Abdominal:     General: Bowel sounds are normal. There is no distension.     Palpations: Abdomen is soft. There is no mass.     Tenderness: There is no abdominal tenderness.  Musculoskeletal:        General: Normal range of motion.     Right lower leg: No edema.      Left lower leg: No edema.     Comments: Negative Tinel and Phalen sign Normal radial pulses  Neurological:     Mental Status: She is alert and oriented to person, place, and time.  Psychiatric:        Mood and Affect: Mood normal.        Latest Ref Rng & Units 09/14/2022   10:11 AM 05/28/2021   11:25 AM 03/11/2020    3:03 PM  CMP  Glucose 70 - 99 mg/dL 94  92  784   BUN 6 - 24 mg/dL 7  16  12    Creatinine 0.57 - 1.00 mg/dL 6.96  2.95  2.84   Sodium 134 - 144 mmol/L 141  137  137   Potassium 3.5 - 5.2 mmol/L 4.3  4.1  3.7   Chloride 96 - 106 mmol/L 105  100  104   CO2 20 - 29 mmol/L 21  25  23    Calcium 8.7 - 10.2 mg/dL 9.4  9.7  9.1   Total Protein 6.0 - 8.5 g/dL 7.2  7.6  7.9   Total Bilirubin 0.0 - 1.2 mg/dL 0.5  0.3  0.7   Alkaline Phos 44 - 121 IU/L 79  77  65   AST 0 - 40 IU/L 15  15  17    ALT 0 - 32 IU/L 9  11  11      Lipid Panel     Component Value Date/Time   CHOL 155 09/14/2022 1011   TRIG 64 09/14/2022 1011   HDL 46 09/14/2022 1011   LDLCALC 96 09/14/2022 1011    CBC    Component Value Date/Time   WBC 6.7 09/14/2022 1011   WBC 9.6 03/11/2020 1503   RBC 4.37 09/14/2022 1011   RBC 4.69 03/11/2020 1503   HGB 12.8 09/14/2022 1011   HCT 37.2 09/14/2022 1011   PLT 373 09/14/2022 1011   MCV 85 09/14/2022 1011   MCH 29.3 09/14/2022 1011   MCH 28.6 03/11/2020 1503   MCHC 34.4 09/14/2022 1011   MCHC 33.7 03/11/2020 1503   RDW 13.7 09/14/2022 1011   LYMPHSABS 3.2 (H) 09/14/2022 1011   EOSABS 0.1 09/14/2022 1011   BASOSABS 0.0 09/14/2022 1011    Lab Results  Component Value Date   HGBA1C 6.1 09/01/2023    Assessment & Plan:      Paresthesia Reports of hand swelling and redness, particularly when arm is in a dependent position. No current numbness. No pain in shoulder or neck. -Neurovascular etiology identified -A1c of 6.1 indicating prediabetes -Trial  of gabapentin -If symptoms persist consider referral for nerve conduction studies      Prediabetes Labs reveal prediabetes with an A1c of 6.1.  Working on a low carbohydrate diet, exercise, weight loss is recommended in order to prevent progression to type 2 diabetes mellitus.  Screening for metabolic disorder Comprehensive metabolic panel, lipid panel, CBC ordered   Meds ordered this encounter  Medications   gabapentin (NEURONTIN) 300 MG capsule    Sig: Take 1 capsule (300 mg total) by mouth at bedtime.    Dispense:  30 capsule    Refill:  3    Follow-up: Return in about 3 months (around 12/01/2023) for paresthesia.       Hoy Register, MD, FAAFP. [facility] [place], [place] [phone]   09/01/2023, 10:53 AM

## 2023-09-01 NOTE — Patient Instructions (Signed)
Paresthesia Paresthesia is a burning or prickling feeling. This feeling can happen in any part of the body. It often happens in the hands, arms, legs, or feet. Usually, it is not painful. In most cases, the feeling goes away in a short time and is not a sign of a serious problem. If you have paresthesia that lasts a long time, you need to see your doctor. Follow these instructions at home: Nutrition Eat a healthy diet. This includes: Eating foods that are high in fiber. These include beans, whole grains, and fresh fruits and vegetables. Limiting foods that are high in fat and sugar. These include fried or sweet foods.  Alcohol use  Do not drink alcohol if: Your doctor tells you not to drink. You are pregnant, may be pregnant, or are planning to become pregnant. If you drink alcohol: Limit how much you have to: 0-1 drink a day for women. 0-2 drinks a day for men. Know how much alcohol is in your drink. In the [place] one drink equals one 12 oz bottle of beer (355 mL), one 5 oz glass of wine (148 mL), or one 1 oz glass of hard liquor (44 mL). General instructions Take over-the-counter and prescription medicines only as told by your doctor. Do not smoke or use any products that contain nicotine or tobacco. If you need help quitting, ask your doctor. If you have diabetes, work with your doctor to make sure your blood sugar stays in a healthy range. If your feet feel numb: Check for redness, warmth, and swelling every day. Wear padded socks and comfortable shoes. These help protect your feet. Keep all follow-up visits. Contact a doctor if: You have paresthesia that gets worse or does not go away. You lose feeling (have numbness) after an injury. Your burning or prickling feeling gets worse when you walk. You have pain or cramps. You feel dizzy or you faint. You have a rash. Get help right away if: You feel weak or have new weakness in an arm or leg. You have trouble walking or  moving. You have problems speaking, understanding, or seeing. You feel confused. You cannot control when you pee (urinate) or poop (have a bowel movement). These symptoms may be an emergency. Get help right away. Call 911. Do not wait to see if the symptoms will go away. Do not drive yourself to the hospital. Summary Paresthesia is a burning or prickling feeling. It often happens in the hands, arms, legs, or feet. In most cases, the feeling goes away in a short time and is not a sign of a serious problem. If you have paresthesia that lasts a long time, you need to be seen by your doctor. This information is not intended to replace advice given to you by your health care provider. Make sure you discuss any questions you have with your health care provider. Document Revised: 08/10/2021 Document Reviewed: 08/10/2021 Elsevier Patient Education  2024 [employer].

## 2023-09-02 LAB — LP+NON-HDL CHOLESTEROL
Cholesterol, Total: 147 mg/dL (ref 100–199)
HDL: 49 mg/dL (ref >39)
LDL Chol Calc (NIH): 85 mg/dL (ref 0–99)
Total Non-HDL-Chol (LDL+VLDL): 98 mg/dL (ref 0–129)
Triglycerides: 67 mg/dL (ref 0–149)
VLDL Cholesterol Cal: 13 mg/dL (ref 5–40)

## 2023-09-02 LAB — CBC WITH DIFFERENTIAL/PLATELET
Basophils Absolute: 0 10*3/uL (ref 0.0–0.2)
Basos: 1 %
EOS (ABSOLUTE): 0.1 10*3/uL (ref 0.0–0.4)
Eos: 2 %
Hematocrit: 39 % (ref 34.0–46.6)
Hemoglobin: 12.3 g/dL (ref 11.1–15.9)
Immature Grans (Abs): 0 10*3/uL (ref 0.0–0.1)
Immature Granulocytes: 0 %
Lymphocytes Absolute: 2.7 10*3/uL (ref 0.7–3.1)
Lymphs: 48 %
MCH: 28.1 pg (ref 26.6–33.0)
MCHC: 31.5 g/dL (ref 31.5–35.7)
MCV: 89 fL (ref 79–97)
Monocytes Absolute: 0.4 10*3/uL (ref 0.1–0.9)
Monocytes: 8 %
Neutrophils Absolute: 2.3 10*3/uL (ref 1.4–7.0)
Neutrophils: 41 %
Platelets: 377 10*3/uL (ref 150–450)
RBC: 4.37 x10E6/uL (ref 3.77–5.28)
RDW: 13.7 % (ref 11.7–15.4)
WBC: 5.6 10*3/uL (ref 3.4–10.8)

## 2023-09-02 LAB — CMP14+EGFR
ALT: 14 IU/L (ref 0–32)
AST: 15 IU/L (ref 0–40)
Albumin: 4.1 g/dL (ref 3.9–4.9)
Alkaline Phosphatase: 89 IU/L (ref 44–121)
BUN/Creatinine Ratio: 12 (ref 9–23)
BUN: 8 mg/dL (ref 6–24)
Bilirubin Total: 0.3 mg/dL (ref 0.0–1.2)
CO2: 23 mmol/L (ref 20–29)
Calcium: 9.4 mg/dL (ref 8.7–10.2)
Chloride: 106 mmol/L (ref 96–106)
Creatinine, Ser: 0.66 mg/dL (ref 0.57–1.00)
Globulin, Total: 3 g/dL (ref 1.5–4.5)
Glucose: 107 mg/dL — ABNORMAL HIGH (ref 70–99)
Potassium: 3.9 mmol/L (ref 3.5–5.2)
Sodium: 143 mmol/L (ref 134–144)
Total Protein: 7.1 g/dL (ref 6.0–8.5)
eGFR: 107 mL/min/{1.73_m2} (ref >59)

## 2024-11-02 ENCOUNTER — Ambulatory Visit: Payer: Self-pay

## 2024-11-02 NOTE — Telephone Encounter (Signed)
Noted.  Agree with disposition.

## 2024-11-02 NOTE — Telephone Encounter (Signed)
 FYI Only or Action Required?: FYI only for provider: ED advised.  Patient was last seen in primary care on 09/01/2023 by Newlin, Enobong, MD.  Called Nurse Triage reporting Hand Pain.  Symptoms began several days ago.  Interventions attempted: Rest, hydration, or home remedies.  Symptoms are: gradually worsening.  Triage Disposition: Call EMS 911 Now  Patient/caregiver understands and will follow disposition?: Unsure          Copied from CRM #[id number]. Topic: Clinical - Red Word Triage >> Nov 02, 2024  8:37 AM Gina Oconnor wrote: Red Word that prompted transfer to Nurse Triage: The patient called in stating she has been having right hand pain for about a week now. She says it feels heavy and it is hard to lift. She also says she is not sleeping well because of it. I will transfer her to E2C2 NT Reason for Disposition  [1] Weakness (i.e., paralysis, loss of muscle strength) of the face, arm / hand, or leg / foot on one side of the body AND [2] sudden onset AND [3] present now  (Exception: Bell's palsy suspected: weakness on one side of the face developing over hours to days, with no other symptoms.)  Answer Assessment - Initial Assessment Questions 1. SYMPTOM: What is the main symptom you are concerned about? (e.g., weakness, numbness)     Tingling, heaviness 2. ONSET: When did this start? (e.g., minutes, hours, days; while sleeping)     X 1 week 3. LAST NORMAL: When was the last time you (the patient) were normal (no symptoms)?     *No Answer* 4. PATTERN Does this come and go, or has it been constant since it started?  Is it present now?     constant 5. CARDIAC SYMPTOMS: Have you had any of the following symptoms: chest pain, difficulty breathing, palpitations?     denies 6. NEUROLOGIC SYMPTOMS: Have you had any of the following symptoms: headache, dizziness, vision loss, double vision, changes in speech, unsteady on your feet?     H/a yesterday - improved with  tylenol . 7. OTHER SYMPTOMS: Do you have any other symptoms?     N/a 8. PREGNANCY: Is there any chance you are pregnant? When was your last menstrual period?     N/a  Protocols used: Neurologic Deficit-A-AH
